# Patient Record
Sex: Female | Born: 1965 | Race: White | Hispanic: No | Smoking: Never smoker
Health system: Southern US, Community
[De-identification: ages and names within clinical notes are randomized; demographics above are authoritative.]

## PROBLEM LIST (undated history)

## (undated) ENCOUNTER — Emergency Department (HOSPITAL_COMMUNITY): Discharge status: Absent without leave | Payer: Medicare Other

## (undated) DIAGNOSIS — F329 Major depressive disorder, single episode, unspecified: Secondary | ICD-10-CM

## (undated) DIAGNOSIS — M549 Dorsalgia, unspecified: Secondary | ICD-10-CM

## (undated) DIAGNOSIS — F259 Schizoaffective disorder, unspecified: Secondary | ICD-10-CM

## (undated) DIAGNOSIS — F32A Depression, unspecified: Secondary | ICD-10-CM

## (undated) DIAGNOSIS — K219 Gastro-esophageal reflux disease without esophagitis: Secondary | ICD-10-CM

## (undated) DIAGNOSIS — E7800 Pure hypercholesterolemia, unspecified: Secondary | ICD-10-CM

## (undated) HISTORY — PX: GASTROSTOMY W/ FEEDING TUBE: SUR642

## (undated) HISTORY — PX: TONSILLECTOMY AND ADENOIDECTOMY: SUR1326

## (undated) HISTORY — PX: UPPER GASTROINTESTINAL ENDOSCOPY: SHX188

## (undated) HISTORY — PX: MM BREAST STEREO BIOPSY LEFT (ARMC HX): HXRAD1824

## (undated) HISTORY — PX: OTHER SURGICAL HISTORY: SHX169

## (undated) HISTORY — PX: COLONOSCOPY: SHX174

---

## 2004-11-08 ENCOUNTER — Other Ambulatory Visit: Admission: RE | Admit: 2004-11-08 | Discharge: 2004-11-08 | Payer: Self-pay | Admitting: Family Medicine

## 2005-11-17 ENCOUNTER — Other Ambulatory Visit: Admission: RE | Admit: 2005-11-17 | Discharge: 2005-11-17 | Payer: Self-pay | Admitting: Family Medicine

## 2005-12-13 ENCOUNTER — Observation Stay (HOSPITAL_COMMUNITY): Admission: EM | Admit: 2005-12-13 | Discharge: 2005-12-15 | Payer: Self-pay | Admitting: Emergency Medicine

## 2005-12-15 ENCOUNTER — Ambulatory Visit: Payer: Self-pay | Admitting: Psychiatry

## 2005-12-15 ENCOUNTER — Inpatient Hospital Stay (HOSPITAL_COMMUNITY): Admission: EM | Admit: 2005-12-15 | Discharge: 2005-12-16 | Payer: Self-pay | Admitting: Psychiatry

## 2007-04-05 ENCOUNTER — Ambulatory Visit: Payer: Self-pay | Admitting: Psychiatry

## 2007-04-05 ENCOUNTER — Inpatient Hospital Stay (HOSPITAL_COMMUNITY): Admission: RE | Admit: 2007-04-05 | Discharge: 2007-04-11 | Payer: Self-pay | Admitting: Psychiatry

## 2008-07-21 ENCOUNTER — Ambulatory Visit: Payer: Self-pay | Admitting: Diagnostic Radiology

## 2008-07-21 ENCOUNTER — Encounter: Payer: Self-pay | Admitting: Emergency Medicine

## 2008-07-22 ENCOUNTER — Inpatient Hospital Stay (HOSPITAL_COMMUNITY): Admission: EM | Admit: 2008-07-22 | Discharge: 2008-08-14 | Payer: Self-pay | Admitting: Internal Medicine

## 2008-11-25 ENCOUNTER — Emergency Department (HOSPITAL_BASED_OUTPATIENT_CLINIC_OR_DEPARTMENT_OTHER): Admission: EM | Admit: 2008-11-25 | Discharge: 2008-11-26 | Payer: Self-pay | Admitting: Emergency Medicine

## 2009-01-20 ENCOUNTER — Ambulatory Visit (HOSPITAL_COMMUNITY): Admission: RE | Admit: 2009-01-20 | Discharge: 2009-01-20 | Payer: Self-pay | Admitting: Psychiatry

## 2010-09-13 LAB — DIFFERENTIAL
Basophils Relative: 2 % — ABNORMAL HIGH (ref 0–1)
Lymphocytes Relative: 17 % (ref 12–46)
Monocytes Absolute: 0.8 10*3/uL (ref 0.1–1.0)
Monocytes Relative: 7 % (ref 3–12)
Neutro Abs: 8.6 10*3/uL — ABNORMAL HIGH (ref 1.7–7.7)
Neutrophils Relative %: 74 % (ref 43–77)

## 2010-09-13 LAB — URINALYSIS, ROUTINE W REFLEX MICROSCOPIC
Glucose, UA: NEGATIVE mg/dL
Ketones, ur: 15 mg/dL — AB
Protein, ur: 30 mg/dL — AB
Urobilinogen, UA: 1 mg/dL (ref 0.0–1.0)

## 2010-09-13 LAB — POCT TOXICOLOGY PANEL

## 2010-09-13 LAB — URINE MICROSCOPIC-ADD ON

## 2010-09-13 LAB — CBC
HCT: 50.1 % — ABNORMAL HIGH (ref 36.0–46.0)
Hemoglobin: 16.8 g/dL — ABNORMAL HIGH (ref 12.0–15.0)
MCHC: 33.4 g/dL (ref 30.0–36.0)
MCV: 85.8 fL (ref 78.0–100.0)
Platelets: 224 10*3/uL (ref 150–400)
RDW: 12.4 % (ref 11.5–15.5)
WBC: 11.8 10*3/uL — ABNORMAL HIGH (ref 4.0–10.5)

## 2010-09-13 LAB — COMPREHENSIVE METABOLIC PANEL
Albumin: 5 g/dL (ref 3.5–5.2)
BUN: 47 mg/dL — ABNORMAL HIGH (ref 6–23)
Calcium: 10.3 mg/dL (ref 8.4–10.5)
Creatinine, Ser: 1.3 mg/dL — ABNORMAL HIGH (ref 0.4–1.2)
Glucose, Bld: 124 mg/dL — ABNORMAL HIGH (ref 70–99)
Total Protein: 8.4 g/dL — ABNORMAL HIGH (ref 6.0–8.3)

## 2010-09-16 LAB — BASIC METABOLIC PANEL
BUN: 4 mg/dL — ABNORMAL LOW (ref 6–23)
BUN: 5 mg/dL — ABNORMAL LOW (ref 6–23)
CO2: 25 mEq/L (ref 19–32)
Chloride: 108 mEq/L (ref 96–112)
Creatinine, Ser: 0.56 mg/dL (ref 0.4–1.2)
Creatinine, Ser: 0.66 mg/dL (ref 0.4–1.2)
GFR calc Af Amer: >60 mL/min (ref >60)
GFR calc Af Amer: >60 mL/min (ref >60)
GFR calc non Af Amer: >60 mL/min (ref >60)
Potassium: 3.8 mEq/L (ref 3.5–5.1)

## 2010-09-16 LAB — COMPREHENSIVE METABOLIC PANEL
ALT: 22 U/L (ref 0–35)
ALT: 24 U/L (ref 0–35)
AST: 21 U/L (ref 0–37)
Albumin: 3 g/dL — ABNORMAL LOW (ref 3.5–5.2)
Albumin: 3 g/dL — ABNORMAL LOW (ref 3.5–5.2)
Alkaline Phosphatase: 35 U/L — ABNORMAL LOW (ref 39–117)
Alkaline Phosphatase: 35 U/L — ABNORMAL LOW (ref 39–117)
Alkaline Phosphatase: 43 U/L (ref 39–117)
BUN: 13 mg/dL (ref 6–23)
BUN: 5 mg/dL — ABNORMAL LOW (ref 6–23)
BUN: 5 mg/dL — ABNORMAL LOW (ref 6–23)
Calcium: 8.2 mg/dL — ABNORMAL LOW (ref 8.4–10.5)
Chloride: 103 mEq/L (ref 96–112)
Chloride: 105 mEq/L (ref 96–112)
Creatinine, Ser: 0.65 mg/dL (ref 0.4–1.2)
GFR calc Af Amer: >60 mL/min (ref >60)
GFR calc non Af Amer: >60 mL/min (ref >60)
Glucose, Bld: 89 mg/dL (ref 70–99)
Glucose, Bld: 95 mg/dL (ref 70–99)
Potassium: 3.6 mEq/L (ref 3.5–5.1)
Potassium: 3.8 mEq/L (ref 3.5–5.1)
Potassium: 3.9 mEq/L (ref 3.5–5.1)
Sodium: 137 mEq/L (ref 135–145)
Sodium: 141 mEq/L (ref 135–145)
Total Bilirubin: 0.5 mg/dL (ref 0.3–1.2)
Total Bilirubin: 0.5 mg/dL (ref 0.3–1.2)
Total Bilirubin: 0.6 mg/dL (ref 0.3–1.2)
Total Protein: 4.4 g/dL — ABNORMAL LOW (ref 6.0–8.3)
Total Protein: 5.5 g/dL — ABNORMAL LOW (ref 6.0–8.3)

## 2010-09-16 LAB — CBC
HCT: 28.2 % — ABNORMAL LOW (ref 36.0–46.0)
HCT: 28.2 % — ABNORMAL LOW (ref 36.0–46.0)
Hemoglobin: 9.6 g/dL — ABNORMAL LOW (ref 12.0–15.0)
Hemoglobin: 9.7 g/dL — ABNORMAL LOW (ref 12.0–15.0)
MCHC: 33.8 g/dL (ref 30.0–36.0)
MCHC: 34.1 g/dL (ref 30.0–36.0)
MCV: 87.3 fL (ref 78.0–100.0)
MCV: 87.4 fL (ref 78.0–100.0)
Platelets: 118 10*3/uL — ABNORMAL LOW (ref 150–400)
Platelets: 132 10*3/uL — ABNORMAL LOW (ref 150–400)
Platelets: 224 10*3/uL (ref 150–400)
Platelets: 228 10*3/uL (ref 150–400)
RBC: 3.02 MIL/uL — ABNORMAL LOW (ref 3.87–5.11)
RBC: 3.44 MIL/uL — ABNORMAL LOW (ref 3.87–5.11)
RDW: 14.6 % (ref 11.5–15.5)
RDW: 14.9 % (ref 11.5–15.5)
WBC: 2.8 10*3/uL — ABNORMAL LOW (ref 4.0–10.5)
WBC: 3.6 10*3/uL — ABNORMAL LOW (ref 4.0–10.5)
WBC: 3.6 10*3/uL — ABNORMAL LOW (ref 4.0–10.5)

## 2010-09-16 LAB — MAGNESIUM
Magnesium: 2.1 mg/dL (ref 1.5–2.5)
Magnesium: 2.2 mg/dL (ref 1.5–2.5)
Magnesium: 2.3 mg/dL (ref 1.5–2.5)

## 2010-09-16 LAB — DIFFERENTIAL
Basophils Absolute: 0 10*3/uL (ref 0.0–0.1)
Basophils Relative: 0 % (ref 0–1)
Eosinophils Absolute: 0.1 10*3/uL (ref 0.0–0.7)
Eosinophils Relative: 4 % (ref 0–5)
Lymphocytes Relative: 18 % (ref 12–46)
Lymphocytes Relative: 25 % (ref 12–46)
Lymphocytes Relative: 36 % (ref 12–46)
Lymphs Abs: 0.6 10*3/uL — ABNORMAL LOW (ref 0.7–4.0)
Lymphs Abs: 1 10*3/uL (ref 0.7–4.0)
Monocytes Relative: 9 % (ref 3–12)
Neutro Abs: 1.5 10*3/uL — ABNORMAL LOW (ref 1.7–7.7)
Neutro Abs: 2.3 10*3/uL (ref 1.7–7.7)
Neutrophils Relative %: 53 % (ref 43–77)
Neutrophils Relative %: 64 % (ref 43–77)

## 2010-09-16 LAB — CULTURE, BLOOD (ROUTINE X 2): Culture: NO GROWTH

## 2010-09-16 LAB — PREALBUMIN
Prealbumin: 14.5 mg/dL — ABNORMAL LOW (ref 18.0–45.0)
Prealbumin: 17.1 mg/dL — ABNORMAL LOW (ref 18.0–45.0)

## 2010-09-16 LAB — PHOSPHORUS
Phosphorus: 4.7 mg/dL — ABNORMAL HIGH (ref 2.3–4.6)
Phosphorus: 4.8 mg/dL — ABNORMAL HIGH (ref 2.3–4.6)
Phosphorus: 5.3 mg/dL — ABNORMAL HIGH (ref 2.3–4.6)

## 2010-09-16 LAB — CORTISOL-AM, BLOOD: Cortisol - AM: 9.1 ug/dL (ref 4.3–22.4)

## 2010-09-16 LAB — TRIGLYCERIDES
Triglycerides: 55 mg/dL (ref <150)
Triglycerides: 99 mg/dL (ref <150)

## 2010-09-16 LAB — CHOLESTEROL, TOTAL: Cholesterol: 137 mg/dL (ref 0–200)

## 2010-09-21 LAB — BASIC METABOLIC PANEL
BUN: 10 mg/dL (ref 6–23)
BUN: 15 mg/dL (ref 6–23)
BUN: 59 mg/dL — ABNORMAL HIGH (ref 6–23)
CO2: 24 mEq/L (ref 19–32)
CO2: 24 mEq/L (ref 19–32)
CO2: 27 mEq/L (ref 19–32)
CO2: 27 mEq/L (ref 19–32)
CO2: 28 mEq/L (ref 19–32)
Calcium: 8.5 mg/dL (ref 8.4–10.5)
Chloride: 103 mEq/L (ref 96–112)
Chloride: 106 mEq/L (ref 96–112)
Chloride: 108 mEq/L (ref 96–112)
Chloride: 109 mEq/L (ref 96–112)
Chloride: 110 mEq/L (ref 96–112)
Chloride: 111 mEq/L (ref 96–112)
Creatinine, Ser: 0.67 mg/dL (ref 0.4–1.2)
Creatinine, Ser: 0.76 mg/dL (ref 0.4–1.2)
Creatinine, Ser: 0.88 mg/dL (ref 0.4–1.2)
GFR calc Af Amer: >60 mL/min (ref >60)
GFR calc Af Amer: >60 mL/min (ref >60)
GFR calc Af Amer: >60 mL/min (ref >60)
GFR calc Af Amer: >60 mL/min (ref >60)
GFR calc non Af Amer: 44 mL/min — ABNORMAL LOW (ref >60)
GFR calc non Af Amer: >60 mL/min (ref >60)
GFR calc non Af Amer: >60 mL/min (ref >60)
Glucose, Bld: 157 mg/dL — ABNORMAL HIGH (ref 70–99)
Glucose, Bld: 97 mg/dL (ref 70–99)
Glucose, Bld: 97 mg/dL (ref 70–99)
Potassium: 3.3 mEq/L — ABNORMAL LOW (ref 3.5–5.1)
Potassium: 3.7 mEq/L (ref 3.5–5.1)
Potassium: 4.1 mEq/L (ref 3.5–5.1)
Sodium: 138 mEq/L (ref 135–145)
Sodium: 139 mEq/L (ref 135–145)
Sodium: 152 mEq/L — ABNORMAL HIGH (ref 135–145)

## 2010-09-21 LAB — URINALYSIS, ROUTINE W REFLEX MICROSCOPIC
Glucose, UA: NEGATIVE mg/dL
Glucose, UA: NEGATIVE mg/dL
Ketones, ur: 40 mg/dL — AB
Nitrite: NEGATIVE
Protein, ur: 100 mg/dL — AB
Protein, ur: 30 mg/dL — AB
pH: 6 (ref 5.0–8.0)
pH: 6 (ref 5.0–8.0)

## 2010-09-21 LAB — CBC
HCT: 50 % — ABNORMAL HIGH (ref 36.0–46.0)
HCT: 30.1 % — ABNORMAL LOW (ref 36.0–46.0)
HCT: 41.9 % (ref 36.0–46.0)
Hemoglobin: 11 g/dL — ABNORMAL LOW (ref 12.0–15.0)
Hemoglobin: 12 g/dL (ref 12.0–15.0)
Hemoglobin: 14.4 g/dL (ref 12.0–15.0)
MCHC: 34.7 g/dL (ref 30.0–36.0)
MCHC: 33.9 g/dL (ref 30.0–36.0)
MCHC: 34.3 g/dL (ref 30.0–36.0)
MCHC: 34.8 g/dL (ref 30.0–36.0)
MCV: 86.7 fL (ref 78.0–100.0)
MCV: 87 fL (ref 78.0–100.0)
MCV: 87.3 fL (ref 78.0–100.0)
MCV: 87.7 fL (ref 78.0–100.0)
Platelets: 222 10*3/uL (ref 150–400)
Platelets: 112 10*3/uL — ABNORMAL LOW (ref 150–400)
Platelets: 116 10*3/uL — ABNORMAL LOW (ref 150–400)
Platelets: 164 10*3/uL (ref 150–400)
Platelets: 99 10*3/uL — ABNORMAL LOW (ref 150–400)
RBC: 3.64 MIL/uL — ABNORMAL LOW (ref 3.87–5.11)
RBC: 4.03 MIL/uL (ref 3.87–5.11)
RBC: 4.08 MIL/uL (ref 3.87–5.11)
RBC: 4.13 MIL/uL (ref 3.87–5.11)
RDW: 13.9 % (ref 11.5–15.5)
RDW: 13.8 % (ref 11.5–15.5)
RDW: 14 % (ref 11.5–15.5)
WBC: 3 10*3/uL — ABNORMAL LOW (ref 4.0–10.5)
WBC: 3.5 10*3/uL — ABNORMAL LOW (ref 4.0–10.5)
WBC: 4.4 10*3/uL (ref 4.0–10.5)

## 2010-09-21 LAB — COMPREHENSIVE METABOLIC PANEL
ALT: 72 U/L — ABNORMAL HIGH (ref 0–35)
AST: 87 U/L — ABNORMAL HIGH (ref 0–37)
Albumin: 5.8 g/dL — ABNORMAL HIGH (ref 3.5–5.2)
Albumin: 2.8 g/dL — ABNORMAL LOW (ref 3.5–5.2)
Albumin: 2.8 g/dL — ABNORMAL LOW (ref 3.5–5.2)
Albumin: 3.1 g/dL — ABNORMAL LOW (ref 3.5–5.2)
BUN: 71 mg/dL — ABNORMAL HIGH (ref 6–23)
BUN: 15 mg/dL (ref 6–23)
BUN: 17 mg/dL (ref 6–23)
CO2: 26 mEq/L (ref 19–32)
Calcium: 10.7 mg/dL — ABNORMAL HIGH (ref 8.4–10.5)
Calcium: 8.3 mg/dL — ABNORMAL LOW (ref 8.4–10.5)
Calcium: 8.4 mg/dL (ref 8.4–10.5)
Calcium: 8.5 mg/dL (ref 8.4–10.5)
Chloride: 108 mEq/L (ref 96–112)
Chloride: 108 mEq/L (ref 96–112)
Creatinine, Ser: 1.6 mg/dL — ABNORMAL HIGH (ref 0.4–1.2)
Creatinine, Ser: 0.66 mg/dL (ref 0.4–1.2)
Creatinine, Ser: 0.67 mg/dL (ref 0.4–1.2)
Creatinine, Ser: 0.74 mg/dL (ref 0.4–1.2)
GFR calc Af Amer: >60 mL/min (ref >60)
GFR calc non Af Amer: >60 mL/min (ref >60)
GFR calc non Af Amer: >60 mL/min (ref >60)
Glucose, Bld: 115 mg/dL — ABNORMAL HIGH (ref 70–99)
Potassium: 4 mEq/L (ref 3.5–5.1)
Sodium: 138 mEq/L (ref 135–145)
Total Bilirubin: 0.4 mg/dL (ref 0.3–1.2)
Total Bilirubin: 0.5 mg/dL (ref 0.3–1.2)
Total Protein: 9.8 g/dL — ABNORMAL HIGH (ref 6.0–8.3)
Total Protein: 5 g/dL — ABNORMAL LOW (ref 6.0–8.3)

## 2010-09-21 LAB — DIFFERENTIAL
Eosinophils Absolute: 0.1 10*3/uL (ref 0.0–0.7)
Eosinophils Relative: 5 % (ref 0–5)
Lymphocytes Relative: 19 % (ref 12–46)
Lymphocytes Relative: 32 % (ref 12–46)
Lymphs Abs: 1 10*3/uL (ref 0.7–4.0)
Monocytes Absolute: 0.4 10*3/uL (ref 0.1–1.0)
Monocytes Absolute: 0.2 10*3/uL (ref 0.1–1.0)
Monocytes Relative: 6 % (ref 3–12)
Neutro Abs: 6 10*3/uL (ref 1.7–7.7)
Neutrophils Relative %: 75 % (ref 43–77)

## 2010-09-21 LAB — GLUCOSE, CAPILLARY
Glucose-Capillary: 100 mg/dL — ABNORMAL HIGH (ref 70–99)
Glucose-Capillary: 104 mg/dL — ABNORMAL HIGH (ref 70–99)
Glucose-Capillary: 108 mg/dL — ABNORMAL HIGH (ref 70–99)
Glucose-Capillary: 108 mg/dL — ABNORMAL HIGH (ref 70–99)
Glucose-Capillary: 112 mg/dL — ABNORMAL HIGH (ref 70–99)
Glucose-Capillary: 114 mg/dL — ABNORMAL HIGH (ref 70–99)
Glucose-Capillary: 129 mg/dL — ABNORMAL HIGH (ref 70–99)
Glucose-Capillary: 95 mg/dL (ref 70–99)
Glucose-Capillary: 98 mg/dL (ref 70–99)
Glucose-Capillary: 99 mg/dL (ref 70–99)

## 2010-09-21 LAB — PHOSPHORUS
Phosphorus: 2 mg/dL — ABNORMAL LOW (ref 2.3–4.6)
Phosphorus: 2.3 mg/dL (ref 2.3–4.6)
Phosphorus: 3.4 mg/dL (ref 2.3–4.6)
Phosphorus: 4.1 mg/dL (ref 2.3–4.6)
Phosphorus: 4.6 mg/dL (ref 2.3–4.6)
Phosphorus: 4.9 mg/dL — ABNORMAL HIGH (ref 2.3–4.6)

## 2010-09-21 LAB — CHOLESTEROL, TOTAL
Cholesterol: 128 mg/dL (ref 0–200)
Cholesterol: 129 mg/dL (ref 0–200)

## 2010-09-21 LAB — VALPROIC ACID LEVEL: Valproic Acid Lvl: <10 ug/mL — ABNORMAL LOW (ref 50.0–100.0)

## 2010-09-21 LAB — URINE CULTURE
Colony Count: NO GROWTH
Special Requests: NEGATIVE

## 2010-09-21 LAB — PROTIME-INR
INR: 1 (ref 0.00–1.49)
Prothrombin Time: 13.4 seconds (ref 11.6–15.2)

## 2010-09-21 LAB — URINE MICROSCOPIC-ADD ON

## 2010-09-21 LAB — FOLATE RBC: RBC Folate: 442 ng/mL (ref 180–600)

## 2010-09-21 LAB — MAGNESIUM
Magnesium: 2.1 mg/dL (ref 1.5–2.5)
Magnesium: 2.2 mg/dL (ref 1.5–2.5)

## 2010-09-21 LAB — PREALBUMIN: Prealbumin: 18.1 mg/dL (ref 18.0–45.0)

## 2010-09-21 LAB — APTT: aPTT: 33 seconds (ref 24–37)

## 2010-09-21 LAB — TSH: TSH: 0.788 u[IU]/mL (ref 0.350–4.500)

## 2010-09-21 LAB — TRIGLYCERIDES: Triglycerides: 174 mg/dL — ABNORMAL HIGH (ref <150)

## 2010-10-19 NOTE — H&P (Signed)
NAME:  ADDISYNN, VASSELL NO.:  [id number]   MEDICAL RECORD NO.:  [id number]          PATIENT TYPE:  IPS   LOCATION:  0500                          FACILITY:  [facility]   PHYSICIAN:  Anselm Jungling, MD  DATE OF BIRTH:  June 19, 1965   DATE OF ADMISSION:  04/05/2007  DATE OF DISCHARGE:                       PSYCHIATRIC ADMISSION ASSESSMENT   IDENTIFYING INFORMATION:  This is a 45 year old female voluntarily  admitted on April 05, 2007.   HISTORY OF PRESENT ILLNESS:  The patient presents with a history of  depression.  States she was not able to pull things together.  Has  been feeling this way for some time.  She reports decreased sleep, not  eating well with some weight loss of an unspecified amount.  She states  that she was very concerned about relapsing on alcohol and came here for  help.   PAST PSYCHIATRIC HISTORY:  Was here in July of 2007.  No current  outpatient mental health treatment.  Has been going to [facility]   SOCIAL HISTORY:  This is a 45 year old divorced female, has a 15-year-  old child.  She is unemployed at this time.  Has completed college.  Has  gotten a bachelor's degree in education.  She has a 71 year old child  and lives with that child.  She was in school at [employer] going for nursing  classes and states she has good family support.   FAMILY HISTORY:  None.   ALCOHOL/DRUG HISTORY:  Nonsmoker.  Has been alcohol-free since 2005.   PRIMARY CARE PHYSICIAN:  None currently.   MEDICAL PROBLEMS:  Denies any acute or chronic health issues.   MEDICATIONS:  None prior to arrival.   ALLERGIES:  SULFA (has a burning sensation).   PHYSICAL EXAMINATION:  GENERAL:  Thin middle-aged female in no acute  distress.  Does appear somewhat pale.  VITAL SIGNS:  Her heart rate 91, respirations 18, blood pressure 116/78,  height 5 feet tall, weight 108 pounds.  HEAD:  Atraumatic.  NECK:  Negative lymphadenopathy.  CHEST:  Clear.  HEART:  Regular rate and  rhythm.  BREASTS:  Exam was deferred.  ABDOMEN:  Soft, nontender abdomen.  PELVIC:  Exam was deferred.  GU:  Exam was deferred.  EXTREMITIES:  Moves all extremities.  No clubbing.  No edema.  5+  against resistance.  SKIN:  Warm and dry.  Again, she appears somewhat pale.  NEUROLOGIC:  Findings are intact.  No tics.  No tremors.   LABORATORY DATA:  No labs were drawn.  The patient does not feel she  needs any laboratory work done.   MENTAL STATUS EXAM:  She is fully alert, cooperative, casually dressed.  Good eye contact.  Her speech is soft-spoken and clear.  Mood is  neutral.  The patient is flat and sad.  Thought processes are coherent.  No evidence of any thought disorder.  Cognitive function intact.  Memory  is good.  Judgment and insight are fair.  She appears sincere.   DIAGNOSES:  AXIS I:  Depressive disorder not otherwise specified.  AXIS II:  Deferred.  AXIS III:  No known medical problems.  AXIS IV:  Problems with occupation, other psychosocial problems with  being divorced, single mom.  AXIS V:  Current 45.   PLAN:  Contract for safety.  Stabilize mood and thinking.  The patient  is considering a need for antidepressant.  Does not feel she needs any  medication at the time.  Feels she needs more structure and get back to  therapy and get back to her meetings.  Casemanager is to obtain her  follow-up appointments.   TENTATIVE LENGTH OF STAY:  Three to four days.      Landry Corporal, N.P.      Anselm Jungling, MD  Electronically Signed    JO/MEDQ  D:  04/06/2007  T:  04/06/2007  Job:  [id number]

## 2010-10-19 NOTE — Consult Note (Signed)
[name], [name]                  ACCOUNT NO.:  [id number]   MEDICAL RECORD NO.:  [id number]          PATIENT TYPE:  INP   LOCATION:  1519                         FACILITY:  [facility]   PHYSICIAN:  Antonietta Breach, M.D.  DATE OF BIRTH:  1965/10/20   DATE OF CONSULTATION:  DATE OF DISCHARGE:                                 CONSULTATION   PAST PSYCHIATRIC HISTORY:  In review of the past medical record, [name] does have a history of admission to the [facility] in October 2008.  She had a discharge from the [facility] on Wellbutrin XL 150 mg daily.  She was also on Ambien at  that time.   She has a history of depression and alcohol use.  She was also admitted  to the [facility] in July 2007.   FAMILY PSYCHIATRIC HISTORY:  None known.   SOCIAL HISTORY:  Marital status divorced.  She does have a 6 year old  child.  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   PAST MEDICAL HISTORY:  Please see the above.   ALLERGIES:  She has NO KNOWN DRUG ALLERGIES.   MEDICATIONS:  The MAR is reviewed.  [name] is on:  1. Ativan 0.5 mg q.8 h p.r.n..  2. She is on Ambien 5 mg q.h.s. p.r.n.   LABORATORY DATA:  Is reviewed.  TSH is negative.  Aspirin and alcohol  are negative.  SGOT 38, SGPT 23.   REVIEW OF SYSTEMS:  Unremarkable.   MENTAL STATUS EXAM:  Please see the history of present illness.  [name] has poor eye contact, constricted affect.  Grossly impaired  judgment and insight.   ASSESSMENT:  AXIS I:  293.83  Mood disorder not otherwise specified,  rule out major depressive disorder recurrent severe.   RECOMMENDATIONS:  Given Ms. [name]'s very severe depression with refusal  of taking p.o. would try Remeron SolTab 15 mg p.o. or sublingual q. 1800  as the initial starting anti-depression dosage.     Antonietta Breach, M.D.  Electronically Signed    JW/MEDQ  D:  07/26/2008  T:  07/26/2008  Job:  [id number]

## 2010-10-19 NOTE — H&P (Signed)
[name], [name]                  ACCOUNT NO.:  [id number]   MEDICAL RECORD NO.:  [id number]          PATIENT TYPE:  INP   LOCATION:  1403                         FACILITY:  [facility]   PHYSICIAN:  Della Goo, M.D. DATE OF BIRTH:  February 28, 1966   DATE OF ADMISSION:  07/22/2008  DATE OF DISCHARGE:                              HISTORY & PHYSICAL   PRIMARY CARE PHYSICIAN:  Unassigned.   CHIEF COMPLAINT:  Not eating.   HISTORY OF PRESENT ILLNESS:  This is a 45 year old female who was seen  at the [facility] emergency department and transferred to  [facility] for admission and medical clearance.  She was  brought to the emergency department after papers had been served by the  sheriff to have her involuntarily committed.  She was brought to the  hospital for a medical clearance and is accompanied by her brother.  The  patient is very despondent and has psychomotor retardation and has  increased hesitation with speaking.  Per her brother, she has a long  history of psychiatric illness.  He states that he had not seen her  since around Christmas and reports were given that the patient has been  locked up in her house for the last 3-4 weeks refusing to talk to  anyone.  The patient also has lost a considerable amount of weight, he  estimates possibly 30 pounds, and he reports that she has not been  eating and drinking.   The patient reports having some shortness of breath and feeling very  tired.  She also states that she has had difficulty sleeping.  She also  states that she has had congestion and coughing up clear mucus, and she  has been observed to be wiping her lips and tongue repetitively.   The patient was evaluated at [facility], laboratory studies  were performed and her chemistry returned with abnormal results, a  sodium level of 160 and a BUN of 71 with a creatinine of 1.6.  She was  referred for medical clearance first because of her severe  hypernatremia.   PAST MEDICAL HISTORY:  1. Previous suicide attempts.  2. Schizophrenia,  3. Bipolar disorder.  4. Severe depression.  5. History of herpes simplex II.   PAST SURGICAL HISTORY:  History of a tonsillectomy and adenoidectomy.   ALLERGIES:  NO KNOWN DRUG ALLERGIES.   MEDICATIONS:  None at this time.  The patient reports being off  medications for 1-2 years and has not seen a psychiatrist in a very long  time.   SOCIAL HISTORY:  The patient lives alone.  She is a nonsmoker,  nondrinker.   FAMILY HISTORY:  Noncontributory.   PHYSICAL EXAMINATION FINDINGS:  GENERAL:  This is a cachectic-appearing  45 year old female in discomfort but no acute distress.  VITAL SIGNS:  Her vital signs at initial intake at [facility]  were temperature 97.2, blood pressure 115/87, heart rate 114,  respirations 20, O2 saturations 98-99%.  HEENT:  Normocephalic, atraumatic.  There is no scleral icterus.  Pupils  are equally round,  reactive to light.  Extraocular movements are intact.  Funduscopic benign.  Nares are patent bilaterally.  Oropharynx is clear.  Mucosa is dry.  NECK:  Supple, full range of motion.  No murmurs, gallops or rubs.  LUNGS:  Clear to auscultation bilaterally.  ABDOMEN:  Positive bowel sounds, soft, nontender, nondistended.  No  hepatosplenomegaly.  EXTREMITIES:  Without cyanosis, clubbing or edema.   LABORATORY STUDIES:  Chemistry with a sodium of 160, potassium 4.0,  chloride 116, carbon dioxide 20, BUN 71, creatinine 1.6 and glucose 115.  Urinalysis reveals small urine hemoglobin, large bilirubin, large  leukocyte esterase.  Alcohol level less than 5.  Acetaminophen level  less than 10.0 and salicylate level less than 1.0.  CT scan of the head  was performed and was found to have no evidence of intracranial  hemorrhage, brain edema, acute infarction intracranial masses or mass  effect.   ASSESSMENT:  A 45 year old female being admitted with:  1.  Hypernatremia.  2. Anorexia.  3. Dehydration.  4. Severe depression.  5. Psychiatric disorder.   PLAN:  The patient will be admitted to a telemetry area and will be  started on hydration therapy with D5W.  Her sodium levels will be  monitored, as well as her BUN and creatinine.  A chest x-ray will be  ordered to evaluate for possible pneumonia.  Also a CBC with  differential will be ordered, as well.  A continuous pulse oximetry has  been ordered secondary to the patient's complaints of shortness of  breath along with supplemental oxygen therapy.  Also, medications for  anxiety and agitation have also been ordered as needed.  A consultation  will be placed with Behavioral Health/psychiatry for further evaluation  and placement for continued treatment of this patient.  The patient will  be placed on DVT and GI prophylaxis, as well.      Della Goo, M.D.  Electronically Signed     HJ/MEDQ  D:  07/22/2008  T:  07/22/2008  Job:  [id number]

## 2010-10-19 NOTE — Discharge Summary (Signed)
[name], [name]                  ACCOUNT NO.:  [id number]   MEDICAL RECORD NO.:  [id number]          PATIENT TYPE:  INP   LOCATION:  1519                         [facility]ACILITY:  [facility]   PHYSICIAN:  Hillery Aldo, M.D.   DATE OF BIRTH:  Jun 10, 1965   DATE OF ADMISSION:  07/22/2008  DATE OF DISCHARGE:                               DISCHARGE SUMMARY   FINAL DISCHARGE DIAGNOSES:  1. Anorexia nervosa.  2. Severe protein calorie malnutrition.  3. Dehydration.  4. Acute renal failure.  5. Hypokalemia.  6. Psychotic mutism.  7. Depression.  8. Hypophosphatasemia.  9. Hyperphosphatemia.  10.Pancytopenia.   DISCHARGE MEDICATIONS:  1. Jevity 1.25 cans daily (may hold tube feeding if the patient      consumes a meal).  2. Claritin 10 mg via PEG tube daily.  3. Reglan 5 mg via PEG tube 30 minutes before tube feeds.  4. Zyprexa 15 mg via PEG tube daily at bedtime.  5. Protonix 40 mg via PEG tube daily.  6. Effexor 37.5 mg via PEG tube b.i.d. (can titrate up to 75 mg as      tolerated).  7. Tylenol 650 mg via PEG tube q.4 h. p.r.n.  8. Free water boluses 50 mL via PEG tube pre and post tube feeds.   For complete list of the consultations, brief admission history of  present illness, procedures and diagnostic studies, and hospital course  through August 07, 2008, please see my previously dictated progress note.   ADDITIONAL PROCEDURES AND DIAGNOSTIC STUDIES:  None.   DISCHARGE LABORATORY VALUES:  White blood cell count was 3.6, hemoglobin  9.6, hematocrit 28.2, platelets 224.  Prealbumin was 14.5.  Triglycerides 99.  Total cholesterol 137.  Magnesium 2.2.  Phosphorus  4.5.  Comprehensive metabolic panel was within normal limits with the  exception of a low total protein of 5.5 and albumin at 3.0.  Calcium was  also low at 8.2.  The a.m. cortisol was 99.1.   HOSPITAL COURSE BY PROBLEM:  From August 07, 2008, through August 14, 2008:  1. Anorexia with severe weight loss and protein-calorie  malnutrition:      The patient continues to receive Jevity 1.2, up to 5 cans daily.      She is beginning to eat at times but still is requiring tube      feeding support for her nutrition.  At this time, she is tolerating      tube feeds without problems.  2. Dehydration with acute renal failure:  No further problems.  3. Electrolyte imbalances:  The patient's electrolytes have been on      the normal side.  4. Psychotic mutism/depression:  The patient continues on Zyprexa.      Effexor has been titrated up to 37.5 mg b.i.d.  This can be further      up titrated to 75 mg b.i.d. as tolerated.  5. Mild pancytopenia:  The patient's thrombocytopenia has resolved.      She still has some mild leukopenia and anemia.  I suspect this is      secondary to  nutritional deficiencies and will resolve over time.  6. Hypotension:  The patient has been hemodynamically stable.  Septic      workup was negative.  She has no evidence of adrenal insufficiency.      She is mentating well.   DISPOSITION:  The patient is medically stable and will be discharged to  an inpatient psychiatric facility on August 14, 2008.   Time spent coordinating care for discharge and discharge instructions  equals 35 minutes.      Hillery Aldo, M.D.  Electronically Signed     CR/MEDQ  D:  08/13/2008  T:  08/13/2008  Job:  [id number]

## 2010-10-19 NOTE — Group Therapy Note (Signed)
[name], [name]                  ACCOUNT NO.:  [id number]   MEDICAL RECORD NO.:  [id number]          PATIENT TYPE:  INP   LOCATION:  1519                         [facility]ACILITY:  [facility]   PHYSICIAN:  Hillery Aldo, M.D.   DATE OF BIRTH:  03-06-1966                                 PROGRESS NOTE   PRIMARY CARE PHYSICIAN:  None.   DISCHARGE DIAGNOSES:  1. Anorexia nervosa.  2. Severe protein-calorie malnutrition.  3. Dehydration.  4. Acute renal failure.  5. Hypokalemia.  6. Psychotic mutism.  7. Depression.  8. Hypophosphatemia.  9. Hyperphosphatemia.  10.Pancytopenia.   DISCHARGE MEDICATIONS:  1. Jevity 1.2 five cans daily.  2. Claritin 10 mg via PEG tube daily.  3. Reglan 5 mg via PEG tube 30 minutes before tube feeds.  4. Zyprexa 15 mg via PEG tube every night.  5. Protonix 40 mg via PEG tube daily.  6. Effexor 37.5 mg via PEG tube daily with plans to up titrate Effexor      to 75 mg via PEG tube b.i.d.  7. Tylenol 650 mg via PEG tube q.4 h. p.r.n.  8. Free water boluses 50 mL via PEG pre and post tube feeds.   CONSULTATIONS:  1. Dr. Jeanie Sewer of psychiatry.  2. Dr. Miles Costain of interventional radiology.   BRIEF ADMISSION HISTORY OF PRESENT ILLNESS:  The patient is a 45 year old female who was brought to the emergency  department after papers had been certified by the sheriff to have her  involuntarily committed.  She was brought specifically for medical  clearance but was found to be dehydrated and in acute renal failure and  therefore was admitted for medical stabilization.  For the full details,  please see the dictated report done by Dr. Lovell Sheehan.   PROCEDURES AND DIAGNOSTIC STUDIES:  1. CT scan of the head on July 21, 2008, was negative.  2. Chest x-ray on July 22, 2008, showed mild hyperinflation with      no acute cardiopulmonary disease.  3. Chest x-ray on July 23, 2008, status post placement of a      peripherally inserted central catheter showed the tip  overlying the      cavoatrial junction with no active disease.  4. Fluoroscopic insertion of a 20-French pull-through gastrostomy tube      placed on August 01, 2008 by Dr. Miles Costain.  5. Chest x-ray on August 05, 2008, showed no acute disease.   DISCHARGE LABORATORY VALUES:  To be dictated at the time of actual discharge.   HOSPITAL COURSE BY PROBLEM:  1. Anorexia with severe weight loss and protein-calorie malnutrition:      The patient was admitted and refused to eat.  According to family,      she has lost approximately 30 pounds in the last 1-2 months.      Attempts were made to put a panda tube in; however, the patient was      not cooperative with this.  She was subsequently put on total      nutritional admixture to address her severe protein-calorie  malnutrition.  The patient continued to refuse to eat, and      ultimately consent was obtained from the patient's mother to place      a percutaneous inserted enteral gastrostomy tube.  The patient has      been commenced on tube feeds and is at the dietitian recommended      goal rate of 5 can of Jevity 1.2 daily.  The patient has had      minimal complaints, although she does not like having the PEG tube      in, she has been cooperative with tube feedings.  2.  Dehydration      with acute renal failure:  The patient's dehydration and acute      renal failure have resolved.  2. Electrolyte imbalances:  The patient has been hypokalemia,      hypophosphatemic, as well as hyperphosphatemic.  Her      hypophosphatemia was felt to be due to refeeding syndrome, and she      was appropriately repleted.  At this point, her phosphorus has been      a little on the high side.  Accordingly, an intact PTH was checked      and found to be within normal limits.  The patient's potassium has      been maintaining with appropriate repletion.  Overall, her      electrolytes have been stable.  3. Psychotic mutism/depression:  The patient has  been seen and      evaluated by Dr. Jeanie Sewer.  She was put on Zyprexa to address      psychotic mutism.  Effexor was added to help deal with her      underlying depression.  Plans are to send the patient to a      psychiatric facility for ongoing inpatient therapy and long-term      treatment.  4. Mild pancytopenia:  The patient does have a mild pancytopenia which      we have been monitoring through the course of her hospital stay.      Overall, her blood counts have remained stable.  She does have      pancytopenia.  I suspect this is secondary to nutritional      deficiencies and will resolve over time.  If not, an outpatient      bone marrow biopsy may be in order.  5. Hypotension:  The patient's a.m. cortisol was normal.  There are no      signs or symptoms of a septic process.  I suspect her blood      pressure simply runs low.  She was put on IV fluids, and at this      time, her blood pressure is stable.  We will discontinue the IV      fluids and monitor her.   DISPOSITION:  The patient is medically stable for transfer to a psychiatric hospital  when a bed is identified.  Discharge summary addendum will be dictated  at that time.      Hillery Aldo, M.D.  Electronically Signed     CR/MEDQ  D:  08/07/2008  T:  08/07/2008  Job:  [id number]

## 2010-10-19 NOTE — Discharge Summary (Signed)
NAMECATHE, [name]                  ACCOUNT NO.:  [id number]   MEDICAL RECORD NO.:  [id number]          PATIENT TYPE:  INP   LOCATION:  1519                         FACILITY:  [facility]   PHYSICIAN:  Pedro Earls, MD     DATE OF BIRTH:  06-12-1965   DATE OF ADMISSION:  07/22/2008  DATE OF DISCHARGE:  07/28/2008                               DISCHARGE SUMMARY   REASON FOR TRANSFER:  Inpatient psych facility transfer for severe  depression and possible psychosis.   DISCHARGE DIAGNOSES:  1. Hyponatremia, resolved.  2. Dehydration, improving and resolved.  3. Failure to thrive as a result of severe depression and psychosis.  4. Hypophosphatemia, resolved.   CONSULTATIONS:  Behavioral Health.   HOSPITAL COURSE:  This is a 45 year old white female patient who had a  past medical history significant for suicidal attempts, schizophrenia,  bipolar disorder, severe depression who was admitted on July 22, 2008, with problems of not eating.  Patient was found to be hyponatremic  and severe dehydration and depression, was initially admitted, and  hydrated with D5W.  Dr. Antonietta Breach was consulted for depression and  the recommendations were to start her on Remeron.   Patient also had a nutritional assessment done as well.  Patient was  continued on TNA.   Patient's condition remained stable throughout the stay.  There were no  other acute events noted.  Patient's hydration was improving and  patient's sodium remained stable.  On July 27, 2008, the plan was to  discharge patient to inpatient psych facility.  Patient was stable at  the time of transfer and was medically stable to be transferred.  Please  see the detailed reconciled list of medication given to the patient at  the time of discharge.      Pedro Earls, MD  Electronically Signed     NS/MEDQ  D:  07/28/2008  T:  07/28/2008  Job:  [id number]

## 2010-10-19 NOTE — Consult Note (Signed)
NAMEJANELYS, [name]                  ACCOUNT NO.:  [id number]   MEDICAL RECORD NO.:  [id number]          PATIENT TYPE:  INP   LOCATION:  1519                         FACILITY:  [facility]   PHYSICIAN:  Antonietta Breach, M.D.  DATE OF BIRTH:  [birth year]   DATE OF CONSULTATION:  07/25/2008  DATE OF DISCHARGE:  08/14/2008                                 CONSULTATION   The attending physician has received additional information from the  patient's mother about her course of symptoms prior to admission.   [name] has continued to not take anything by mouth.  She has mostly  mutism.  At home, her mother told Dr. Darnelle Catalan that the patient became  socially isolated and paranoid to the point that she locked her door and  would not eat anything   MENTAL STATUS EXAM:  [name] looks away from the examiner.  Her head is  down.  Her affect is guarded.  Mood:  She will not talk.   ASSESSMENT:  293.81 psychotic disorder not otherwise specified.  She  does appear to have psychotic mutism at this point with a history of  paranoia prior to this stage.   RECOMMENDATIONS:  Due to her psychosis and secondary risk of potential  lethal self-neglect, would force Zyprexa if Dr. Darnelle Catalan agrees with the  undersigned starting with 5 mg p.o. or IM daily in order to reverse the  psychosis.      Antonietta Breach, M.D.  Electronically Signed     JW/MEDQ  D:  08/31/2008  T:  08/31/2008  Job:  [id number]

## 2010-10-19 NOTE — Consult Note (Signed)
[name], [name]                  ACCOUNT NO.:  [id number]   MEDICAL RECORD NO.:  [id number]          PATIENT TYPE:  INP   LOCATION:  1519                         FACILITY:  [facility]   PHYSICIAN:  Antonietta Breach, M.D.  DATE OF BIRTH:  [birth year]   DATE OF CONSULTATION:  07/30/2008  DATE OF DISCHARGE:                                 CONSULTATION   [name] continues with local mutism.  She refused p.o. Zyprexa today.  She continues with guarded affect, poor eye contact.  However, she is  alert.  She clearly is paranoid.  She looks away from the examiner.  She  will not provide any additional information.   Her facies are flat.   [name] had not demonstrated any rigidity or other extrapyramidal side  effects from the Zyprexa.  She has tolerated it well when she has taken  it by mouth.   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   RECOMMENDATIONS:  The undersigned will order Zyprexa 10 mg p.o. or IM  q.1600 hours for anti psychosis.   Concerning disposition, [name] does not have the capacity for informed  consent given her psychosis and secondary impaired judgment.  She would  be at risk for lethal self-neglect.   She is psychiatrically appropriate for a skilled nursing facility.      Antonietta Breach, M.D.  Electronically Signed     JW/MEDQ  D:  07/30/2008  T:  07/30/2008  Job:  [id number]

## 2010-10-19 NOTE — Consult Note (Signed)
[name], [name]                  ACCOUNT NO.:  [id number]   MEDICAL RECORD NO.:  [id number]          PATIENT TYPE:  INP   LOCATION:  1519                         FACILITY:  [facility]   PHYSICIAN:  Antonietta Breach, M.D.  DATE OF BIRTH:  03-13-1966   DATE OF CONSULTATION:  07/23/2008  DATE OF DISCHARGE:                                 CONSULTATION   REQUESTING PHYSICIAN:  [facility].   REASON FOR CONSULTATION:  Depression.   HISTORY OF PRESENT ILLNESS:  [name] is a 45 year old female admitted  to the [facility] on July 22, 2008 due to hypernatremia.   IDENTIFICATION:  She has approximately 9 weeks of pensive withdrawal,  poor energy, depressed mood and reduced appetite.  She has had decreased  p.o. intake with a loss of 30 pounds, and dehydration.   She is 95% mute and will not verbalize at all with the undersigned  today.   She continues to lie in her bed.   DICTATION ENDED AT THIS POINT.      Antonietta Breach, M.D.  Electronically Signed     JW/MEDQ  D:  07/26/2008  T:  07/26/2008  Job:  [id number]

## 2010-10-22 NOTE — Discharge Summary (Signed)
[name], [name]                  ACCOUNT NO.:  [id number]   MEDICAL RECORD NO.:  [id number]          PATIENT TYPE:  IPS   LOCATION:  0508                          [facility]ACILITY[facility]  [facility]   PHYSICIAN:  Jasmine Pang, M.D. DATE OF BIRTH:  03-08-1966   DATE OF ADMISSION:  04/05/2007  DATE OF DISCHARGE:  04/11/2007                               DISCHARGE SUMMARY   IDENTIFICATION:  This is a 45 year old female who was admitted on a  voluntary basis on April 05, 2007.   HISTORY OF PRESENT ILLNESS:  The patient presents with a history of  depression.  She states she was not able to pull things together.  She  states she has been feeling this way for sometime.  She reports  decreased sleep, not eating well with some weight loss of unspecified  amount.  She states that she was very concerned about relapsing on  alcohol and came here for help.  She was here in July 2007.  There is no  current outpatient mental health treatment.  She has been going to [employer].  She is a nonsmoker.  She has been alcohol-free since 2005.  She denies any acute or chronic health issues.  She was on no  medications.  She has allergy to SULFA (burning sensation).   PHYSICAL FINDINGS:  The patient's physical exam was within normal  limits.  She was healthy with no acute medical or physical problems  noted.   HOSPITAL COURSE:  Upon admission, the patient was started on Ambien 10  mg p.o. q.h.s. p.r.n.  She was also started on Wellbutrin XL 150 mg p.o.  daily.  The patient was unclear whether she wanted to be on the  Wellbutrin or not and vacillated between asking me to stop the  medication versus wanting to take it.  I explained that I would leave  the order there for her Wellbutrin, and if she do not want to take it,  she was not required to do so.  The patient tolerated medications well  with no significant side effects.  She was initially reserved and lying  in bed.  She was appropriate in individual therapy  with me.  She was  able to participate appropriately in unit therapeutic groups and  activities.  As hospitalization progressed, she became less depressed  and less anxious.  There was no suicidal ideation.  She continued to  vacillate as to whether to stay on her Wellbutrin, but did decide to do  this.  She discussed her family and stated that her parents are very  supportive.  She has a 43 year old son.  She at one point felt her main  problem was alcohol.  She had a family session with her mother.  She was  feeling better and her mother was very supportive.  On April 11, 2007,  she felt a little worse like she had gone backwards.  She was  initially looking for longer-term treatment at [facility] for her alcohol  abuse.  She went back and forth about this treatment as well.  On  April 11, 2007, mental status had improved markedly from admission  status.  The patient was sleeping and eating well.  She was friendly and  cooperative with good eye contact.  Speech was normal rate and flow.  She felt like her mood was somewhat anxious and depressed, but less so  than upon admission.  Affect was consistent with mood.  She denies  suicidal or homicidal ideation.  No thoughts of self-injurious behavior.  No auditory or visual hallucinations.  No paranoia or delusions.  Thoughts were logical and goal-directed.  Thought content, no  predominant theme.  Cognitive was grossly back to baseline.  It was felt  the patient was safe to be discharged today.   DISCHARGE DIAGNOSES:  AXIS I:  Depressive disorder, not otherwise  specified.  History of alcohol abuse.  AXIS II:  None.  AXIS III:  None.  AXIS IV:  Severe problems with occupation, other psychosocial problems  with being divorced, and a single mom, and burden of psychiatric  illness.  AXIS V:  Global assessment of functioning was 50 upon discharge.  Global  assessment of functioning was 45 upon admission.  GAF was 65, highest  past  year.   POST HOSPITAL CARE PLAN:  The patient had no specific activity level or  dietary restrictions.  The patient will be seen by Bearl Mulberry at  [facility] on April 13, 2007, at 5:00 p.m.   DISCHARGE MEDICATIONS:  Wellbutrin XL 150 mg daily and Ambien 10 mg at  bedtime p.r.n. insomnia.      Jasmine Pang, M.D.  Electronically Signed     BHS/MEDQ  D:  05/01/2007  T:  05/02/2007  Job:  [id number]

## 2010-10-22 NOTE — H&P (Signed)
[name], [name]                  ACCOUNT NO.:  [id number]   MEDICAL RECORD NO.:  [id number]          PATIENT TYPE:  INP   LOCATION:  1830                         FACILITY:  [facility]   PHYSICIAN:  Jackie Plum, M.D.DATE OF BIRTH:  12/10/1965   DATE OF ADMISSION:  12/13/2005  DATE OF DISCHARGE:                                HISTORY & PHYSICAL   REASON FOR ADMISSION:  For 23-hour observation.   CHIEF COMPLAINT:  Suicide attempt, drug abuse.   HISTORY OF PRESENT ILLNESS:  The patient is a 45 year old Caucasian lady who  was brought to the hospital by ambulance after injecting her left arm with  crushed mixture of Ambien 10 mg pills with sodium chloride and Nadolol.  According to the patient, she has been under a lot of stress, but she did  not really specify and articulate about taking those steps in her effort to  commit suicide.  In the ED, the patient was seen by Dr. Jennette Kettle.  Lab work  was obtained.  However, __________ overnight in view of the fact she had  taken a combination of these medicines with Nadolol to make sure she does  not bottom out on her blood pressure.   PAST MEDICAL HISTORY:  History of hypertension and anxiety.   FAMILY HISTORY:  Negative for heart disease, diabetes mellitus.   SOCIAL HISTORY:  The patient is currently trying to enter nursing school.  She does not smoke cigarettes and does not drink alcohol.   REVIEW OF SYSTEMS:  The patient denies any fever or chills, chest pain,  shortness of breath, hypertension, PND, orthopnea, visual changes,  headaches, extremity weakness, dysuria, polyuria, polydipsia, diarrhea,  constipation.   PHYSICAL EXAMINATION:  VITAL SIGNS:  Blood pressure 110/68, pulse 55,  temperature 96.9, oxygen saturation on room air 100%.  GENERAL:  She was comfortable looking.  In no acute distress.  HEENT:  Normocephalic, atraumatic.  Pupils were equal, round, reactive to  light.  Extraocular muscles intact..  Oropharynx was  moist.  NECK:  Supple.  LUNGS:  Clear to auscultation.  CARDIAC:  Regular.  No gallops.  ABDOMEN:  Soft.  Bowel sounds were present.  EXTREMITIES:  No cyanosis.  CNS:  She was alert, oriented.  She had depressed facies.  She moved all  extremities.   LABORATORY DATA:  Sodium 137, potassium 4.5, chloride 109, BUN 8, glucose  91.  Venous pH 7.377, pCO2 33.5, bicarbonate 19.6.  Hematocrit 37.8,  hemoglobin 12.6.  Creatinine 1.2.  Alcohol level less than 5.  Drug screen  studies were all unremarkable.   IMPRESSION:  1.  Suicide attempt.  2.  Drug abuse.   The patient is admitted __________.  She remains hemodynamically stable and  hopefully she will continue to do so.  Will give her some supportive care  including IV normal saline at a moderate rate and get her to be seen by  psychiatrist tomorrow and possibly be discharged to psychiatric hospital at  if she is deemed appropriate for admission psychiatric treatment.      Jackie Plum, M.D.  Electronically Signed  GO/MEDQ  D:  12/14/2005  T:  12/14/2005  Job:  [id number]

## 2010-10-22 NOTE — Consult Note (Signed)
[name], WARZECHA                  ACCOUNT NO.:  [id number]   MEDICAL RECORD NO.:  [id number]          PATIENT TYPE:  INP   LOCATION:  1519                         FACILITY:  [facility]   PHYSICIAN:  Antonietta Breach, M.D.  DATE OF BIRTH:  [birth year]   DATE OF CONSULTATION:  08/23/2008  DATE OF DISCHARGE:  08/14/2008                                 CONSULTATION   REASON FOR CONSULTATION:  This is a follow-up consultation report for  severe depression and psychosis.   Ms. Fryberger continues to verbalize very little, she is mostly mute.  She  has anhedonia.  She did ambulate some with the tech, but her energy is  still very poor.   The undersigned gathered some history from the mother.  Mrs. Daphine Deutscher  confirmed a history of electroconvulsive therapy.  She has had 3 courses  of ECT which resulted in improved energy and improved interests.   These occurred during her late teens and early 57s.  The undersigned  shared no information with the patient's mother, only received history  from the patient's mother.   EXAMINATION:  VITAL SIGNS:  Temperature 98.8, pulse 80, respiratory rate  16, blood pressure 80/54, O2 saturation on room air 99%.   MENTAL STATUS EXAM:  Ms. Gavel is lying in a partially reclined supine  position in her hospital bed with poor eye contact, her eyes are open.  She looks periodically around the room.  She is appearing alert by her  facial expression and eye movements.  Her affect is flat, she has a flat  facial expression.  She is mute throughout the exam.  The undersigned  does make some reflective short ego-supportive comments, however the  patient does not respond.   ASSESSMENT:  Major depressive disorder, recurrent, with psychotic  features.   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   She is assessed to lack the capacity  for informed consent.  She would be  at risk for lethal self-neglect given the severity of her symptoms.   RECOMMENDATIONS:  1. Would start venlafaxine at 37.5 mg p.o. every morning and increase      by 37.5 mg per day to the target dose of 75 mg by mouth 2 times      daily while monitoring blood pressure, as hypertension can be an      adverse side effect of venlafaxine.  2. Would continue the antipsychotic trial with Zyprexa and increase      the Zyprexa to 15 mg by mouth or intramuscularly daily.   DISCUSSION:  While the patient cannot provide a description of her  internal mood state, based upon the fact that she is not responding to  the Zyprexa trial, and based upon her past history of depression as well  as her preference to stay in bed, venlafaxine is now started for anti-  depression.   Would admit to an inpatient psychiatric ward, as soon as possible, for  further evaluation and treatment.  Based upon the patient's history of  response, given by the mother, electroconvulsive therapy could be an  option.   Would continue efforts at low-stimulation ego support to facilitate a  therapeutic alliance and would provide a quiet low-stimulation  environment.      Antonietta Breach, M.D.  Electronically Signed     JW/MEDQ  D:  08/23/2008  T:  08/24/2008  Job:  [id number]

## 2010-10-22 NOTE — Discharge Summary (Signed)
[name], [name]                  ACCOUNT NO.:  [id number]   MEDICAL RECORD NO.:  [id number]          PATIENT TYPE:  IPS   LOCATION:  0300                          [facility]ILITY[facility]  [facility]   PHYSICIAN:  Anselm Jungling, MD  DATE OF BIRTH:  April 27, 1966   DATE OF ADMISSION:  12/14/2005  DATE OF DISCHARGE:  12/16/2005                                 DISCHARGE SUMMARY   IDENTIFYING DATA/REASON FOR ADMISSION:  The patient is a 45 year old  separated white female, mother of a 66 year old, part-time Engineer, site,  admitted for depression, in the aftermath of an overdose suicide attempt.  She is a patient of Dr. Emerson Monte.  She had been on an outpatient  regimen of Wellbutrin 200 mg twice daily.  Upon admission, she indicated  that she was glad that she survived her overdose and had no further suicidal  ideation.  Please refer to the admission note for further details pertaining  to the symptoms, circumstances and history that led to her hospitalization.   INITIAL DIAGNOSTIC IMPRESSION:  She was given an initial AXIS I diagnosis of  major depressive disorder, recurrent without psychotic features.   MEDICAL/LABORATORY:  The patient came to [place] in good health, but was taking  Protonix for GERD, Famvir for genital herpes, and Claritin for seasonal  allergies as well as an oral birth control preparation.  These were  continued.  There were no acute medical issues.   HOSPITAL COURSE:  The patient was admitted to the adult inpatient  psychiatric service.  She presented as a well-nourished, well-developed  female who was pleasant, cooperative, fully oriented and well-organized.  Her mood was depressed with sad affect, but she denied suicidal ideation.  Her thoughts and speech were normally organized.  There was nothing to  suggest any underlying psychosis or formal thought disorder.  She was  continued on a regimen of Wellbutrin SR 200 mg twice daily.  She was never  very forthcoming as to the  specific reasons that led to her overdose, but  gave strong reassurances that she was not having any further thoughts of  harming herself.  She indicated that she planned to return home to live upon  discharge from the hospital.  The patient was not really interested in  having any family come in and being involved with her treatment.  She  indicated that she felt strongly about continuing to see Dr. Nolen Mu on an  outpatient basis, and Valinda Hoar, her therapist.   The patient requested discharge on the third hospital day.   AFTERCARE:  The patient was to follow-up with Dr. Nolen Mu on January 02, 2006,  and with Valinda Hoar also on January 02, 2006.  She was also given contact  information for vocational rehabilitation, and an appointment on December 27, 2005.   DISCHARGE MEDICATIONS:  1.  Protonix 40 mg daily.  2.  Wellbutrin SR 200 mg b.i.d.  3.  Famvir 125 mg daily.  4.  Claritin 10 mg daily.  5.  Her usual oral contraceptive.   DISCHARGE DIAGNOSES:  AXIS I:  Major depressive  disorder, recurrent without  psychotic features.  AXIS II:  Deferred.  AXIS III:  Genital herpes, seasonal allergies, gastroesophageal reflux  disease.  AXIS IV:  Stressors:  Severe.  AXIS V:  GAF on discharge 65.           ______________________________  Anselm Jungling, MD  Electronically Signed     SPB/MEDQ  D:  12/19/2005  T:  12/19/2005  Job:  [id number]

## 2012-05-06 ENCOUNTER — Encounter (HOSPITAL_COMMUNITY): Payer: Self-pay

## 2012-05-06 ENCOUNTER — Inpatient Hospital Stay (HOSPITAL_COMMUNITY)
Admission: AD | Admit: 2012-05-06 | Discharge: 2012-05-15 | DRG: 885 | Disposition: A | Discharge status: Home / Self Care | Payer: Medicare Other | Source: Other Acute Inpatient Hospital | Attending: Psychiatry | Admitting: Psychiatry

## 2012-05-06 DIAGNOSIS — F411 Generalized anxiety disorder: Secondary | ICD-10-CM | POA: Diagnosis present

## 2012-05-06 DIAGNOSIS — Z23 Encounter for immunization: Secondary | ICD-10-CM

## 2012-05-06 DIAGNOSIS — F339 Major depressive disorder, recurrent, unspecified: Secondary | ICD-10-CM

## 2012-05-06 DIAGNOSIS — K219 Gastro-esophageal reflux disease without esophagitis: Secondary | ICD-10-CM | POA: Diagnosis present

## 2012-05-06 DIAGNOSIS — M549 Dorsalgia, unspecified: Secondary | ICD-10-CM | POA: Diagnosis present

## 2012-05-06 DIAGNOSIS — R45851 Suicidal ideations: Secondary | ICD-10-CM

## 2012-05-06 DIAGNOSIS — E7800 Pure hypercholesterolemia, unspecified: Secondary | ICD-10-CM | POA: Diagnosis present

## 2012-05-06 DIAGNOSIS — F419 Anxiety disorder, unspecified: Secondary | ICD-10-CM

## 2012-05-06 DIAGNOSIS — F332 Major depressive disorder, recurrent severe without psychotic features: Principal | ICD-10-CM | POA: Diagnosis present

## 2012-05-06 HISTORY — DX: Dorsalgia, unspecified: M54.9

## 2012-05-06 HISTORY — DX: Depression, unspecified: F32.A

## 2012-05-06 HISTORY — DX: Pure hypercholesterolemia, unspecified: E78.00

## 2012-05-06 HISTORY — DX: Gastro-esophageal reflux disease without esophagitis: K21.9

## 2012-05-06 HISTORY — DX: Schizoaffective disorder, unspecified: F25.9

## 2012-05-06 HISTORY — DX: Major depressive disorder, single episode, unspecified: F32.9

## 2012-05-06 MED ORDER — BUPROPION HCL ER (XL) 300 MG PO TB24
300.0000 mg | ORAL_TABLET | Freq: Every day | ORAL | Status: DC
Start: 2012-05-07 — End: 2012-05-09
  Administered 2012-05-07 – 2012-05-09 (×3): 300 mg via ORAL
  Filled 2012-05-06 (×4): qty 1

## 2012-05-06 MED ORDER — BUPROPION HCL ER (XL) 300 MG PO TB24: 300.0000 mg | ORAL_TABLET | Freq: Every day | ORAL | Status: DC

## 2012-05-06 MED ORDER — BUPROPION HCL ER (SR) 150 MG PO TB12
300.0000 mg | ORAL_TABLET | Freq: Two times a day (BID) | ORAL | Status: DC
  Administered 2012-05-06: 300 mg via ORAL
  Filled 2012-05-06 (×3): qty 2

## 2012-05-06 MED ORDER — GABAPENTIN 300 MG PO CAPS
300.0000 mg | ORAL_CAPSULE | Freq: Every day | ORAL | Status: DC
  Administered 2012-05-06 – 2012-05-14 (×9): 300 mg via ORAL
  Filled 2012-05-06 (×12): qty 1

## 2012-05-06 MED ORDER — ATORVASTATIN CALCIUM 20 MG PO TABS
20.0000 mg | ORAL_TABLET | Freq: Every day | ORAL | Status: DC
  Administered 2012-05-06 – 2012-05-14 (×9): 20 mg via ORAL
  Filled 2012-05-06 (×12): qty 1

## 2012-05-06 MED ORDER — ALUM & MAG HYDROXIDE-SIMETH 200-200-20 MG/5ML PO SUSP
30.0000 mL | ORAL | Status: DC | PRN
  Administered 2012-05-09: 30 mL via ORAL

## 2012-05-06 MED ORDER — IBUPROFEN 600 MG PO TABS
600.0000 mg | ORAL_TABLET | Freq: Three times a day (TID) | ORAL | Status: DC | PRN
  Administered 2012-05-06 – 2012-05-15 (×7): 600 mg via ORAL
  Filled 2012-05-06 (×7): qty 1

## 2012-05-06 MED ORDER — ARIPIPRAZOLE 15 MG PO TABS
15.0000 mg | ORAL_TABLET | Freq: Every day | ORAL | Status: DC
  Administered 2012-05-07 – 2012-05-10 (×4): 15 mg via ORAL
  Filled 2012-05-06 (×5): qty 1

## 2012-05-06 MED ORDER — PAROXETINE HCL 20 MG PO TABS
20.0000 mg | ORAL_TABLET | Freq: Every day | ORAL | Status: DC
  Filled 2012-05-06: qty 1

## 2012-05-06 MED ORDER — ACETAMINOPHEN 325 MG PO TABS
650.0000 mg | ORAL_TABLET | Freq: Four times a day (QID) | ORAL | Status: DC | PRN
Start: 2012-05-06 — End: 2012-05-15

## 2012-05-06 MED ORDER — FAMCICLOVIR 500 MG PO TABS
250.0000 mg | ORAL_TABLET | Freq: Every day | ORAL | Status: DC
  Administered 2012-05-06 – 2012-05-14 (×9): 250 mg via ORAL
  Filled 2012-05-06 (×12): qty 0.5

## 2012-05-06 MED ORDER — MAGNESIUM CITRATE PO SOLN
0.5000 | Freq: Once | ORAL | Status: AC
  Administered 2012-05-06: 0.5 via ORAL

## 2012-05-06 MED ORDER — ARIPIPRAZOLE 15 MG PO TABS
30.0000 mg | ORAL_TABLET | Freq: Every day | ORAL | Status: DC
  Administered 2012-05-06: 30 mg via ORAL
  Filled 2012-05-06 (×2): qty 2

## 2012-05-06 MED ORDER — LURASIDONE HCL 40 MG PO TABS
40.0000 mg | ORAL_TABLET | Freq: Every day | ORAL | Status: DC
  Administered 2012-05-06 – 2012-05-07 (×2): 40 mg via ORAL
  Filled 2012-05-06 (×3): qty 1

## 2012-05-06 MED ORDER — PANTOPRAZOLE SODIUM 40 MG PO TBEC
40.0000 mg | DELAYED_RELEASE_TABLET | Freq: Every day | ORAL | Status: DC
  Administered 2012-05-06 – 2012-05-15 (×10): 40 mg via ORAL
  Filled 2012-05-06 (×13): qty 1

## 2012-05-06 MED ORDER — CLONAZEPAM 1 MG PO TABS
1.0000 mg | ORAL_TABLET | Freq: Three times a day (TID) | ORAL | Status: DC
  Administered 2012-05-06 – 2012-05-08 (×6): 1 mg via ORAL
  Filled 2012-05-06 (×6): qty 1

## 2012-05-06 MED ORDER — TRAZODONE HCL 50 MG PO TABS
50.0000 mg | ORAL_TABLET | Freq: Every evening | ORAL | Status: DC | PRN
  Administered 2012-05-06 – 2012-05-14 (×6): 50 mg via ORAL
  Filled 2012-05-06 (×5): qty 1

## 2012-05-06 MED ORDER — INFLUENZA VIRUS VACC SPLIT PF IM SUSP
0.5000 mL | Freq: Once | INTRAMUSCULAR | Status: AC
  Administered 2012-05-06: 0.5 mL via INTRAMUSCULAR

## 2012-05-06 MED ORDER — POLYETHYLENE GLYCOL 3350 17 G PO PACK
17.0000 g | PACK | ORAL | Status: DC
  Administered 2012-05-06 – 2012-05-12 (×4): 17 g via ORAL
  Filled 2012-05-06 (×7): qty 1

## 2012-05-06 MED ORDER — PAROXETINE HCL 30 MG PO TABS
30.0000 mg | ORAL_TABLET | Freq: Every day | ORAL | Status: DC
  Administered 2012-05-06 – 2012-05-14 (×9): 30 mg via ORAL
  Filled 2012-05-06 (×2): qty 1
  Filled 2012-05-06: qty 3
  Filled 2012-05-06 (×9): qty 1

## 2012-05-06 MED ORDER — MAGNESIUM HYDROXIDE 400 MG/5ML PO SUSP
30.0000 mL | Freq: Every day | ORAL | Status: DC | PRN
  Administered 2012-05-07: 30 mL via ORAL

## 2012-05-06 NOTE — Tx Team (Signed)
Initial Interdisciplinary Treatment Plan  PATIENT STRENGTHS: (choose at least two) Ability for insight Average or above average intelligence  PATIENT STRESSORS: Loss of step father and during the holiday season pt reports feeling increased depression*   PROBLEM LIST: Problem List/Patient Goals Date to be addressed Date deferred Reason deferred Estimated date of resolution  SI to cut carotid artery with razor      Depression                                                 DISCHARGE CRITERIA:  Ability to meet basic life and health needs Adequate post-discharge living arrangements Improved stabilization in mood, thinking, and/or behavior Need for constant or close observation no longer present Reduction of life-threatening or endangering symptoms to within safe limits Safe-care adequate arrangements made Verbal commitment to aftercare and medication compliance  PRELIMINARY DISCHARGE PLAN: Return to previous living arrangement  PATIENT/FAMIILY INVOLVEMENT: This treatment plan has been presented to and reviewed with the patient, [name], and/or family member.  The patient and family have been given the opportunity to ask questions and make suggestions.  [name] 05/06/2012, 3:49 AM

## 2012-05-06 NOTE — Progress Notes (Signed)
Psychoeducational Group Note  Date:  05/06/2012 Time:  1515  Group Topic/Focus:  Conflict Resolution:   The focus of this group is to discuss the conflict resolution process and how it may be used upon discharge.  Participation Level:  Did Not Attend  Participation Quality:    Affect:    Cognitive:    Insight:    Engagement in Group:    Additional Comments:  Pt refused to attend, pt was sleeping.  Isla Pence M 05/06/2012, 2:11 PM

## 2012-05-06 NOTE — [facility] Suicide Risk Assessment (Signed)
Suicide Risk Assessment  Admission Assessment     Nursing information obtained from:  Patient Demographic factors:  Caucasian;Unemployed Current Mental Status:  Suicidal ideation indicated by patient;Suicide plan;Self-harm thoughts;Belief that plan would result in death Loss Factors:  Loss of significant relationship;Financial problems / change in socioeconomic status Historical Factors:  Prior suicide attempts;Anniversary of important loss Risk Reduction Factors:  Religious beliefs about death;Positive social support;Positive therapeutic relationship  CLINICAL FACTORS:   Depression:   Insomnia Severe  COGNITIVE FEATURES THAT CONTRIBUTE TO RISK:  Closed-mindedness Thought constriction (tunnel vision)    SUICIDE RISK:   Severe:  Frequent, intense, and enduring suicidal ideation, specific plan, no subjective intent, but some objective markers of intent (i.e., choice of lethal method), the method is accessible, some limited preparatory behavior, evidence of impaired self-control, severe dysphoria/symptomatology, multiple risk factors present, and few if any protective factors, particularly a lack of social support.  PLAN OF CARE: Supportive approach/coping skills/she can contract for safety in the unit                               Reassess her medications   [name] A 05/06/2012, 3:11 PM

## 2012-05-06 NOTE — Progress Notes (Signed)
Patient admitted involuntarily for SI to use razor to cut her carotid artery. Patient reports that during the holiday season she becomes more depressed and this is also the anniversary of her step-fathers death. Patient reports that she currently lives at [facility].  Patient was pleasant and cooperative during admission.Patient denies hi/a/v hallucinations but reports passive si and verbally contracts for safety. Medical hx include depression, schizoaffective d/o,  gerd, lower back pain and high cholesterol.  Patient has had previous suicide attempts. Patient was oriented to unit, offered meal but she declined. Safety maintained with 15 min checks, will continue to monitor.

## 2012-05-06 NOTE — H&P (Signed)
[name] is an 46 y.o. female.   Chief Complaint: Major depression, suicidal HPI: Patient's depression increased and she became suicidal  Past Medical History  Diagnosis Date  . GERD (gastroesophageal reflux disease)   . Back pain   . Depression   . Schizoaffective disorder   . High cholesterol     History reviewed. No pertinent past surgical history.  History reviewed. No pertinent family history. Social History:  reports that she has never smoked. She does not have any smokeless tobacco history on file. She reports that she drinks about one ounce of alcohol per week. She reports that she does not use illicit drugs.  Allergies:  Allergies  Allergen Reactions  . Sulfa Antibiotics     Not sure of allergy but happened when she used a topical sulfa cream.    Medications Prior to Admission  Medication Sig Dispense Refill  . ARIPiprazole (ABILIFY) 30 MG tablet Take 30 mg by mouth daily.      Marland Kitchen atorvastatin (LIPITOR) 20 MG tablet Take 20 mg by mouth at bedtime.      Marland Kitchen buPROPion (WELLBUTRIN SR) 150 MG 12 hr tablet Take 300 mg by mouth daily after breakfast. Takes two in the morning and one in early afternoon.      Marland Kitchen buPROPion (ZYBAN) 150 MG 12 hr tablet Take 150 mg by mouth daily at 2 PM daily at 2 PM. Takes two in the morning and one in early afternoon.      . clonazePAM (KLONOPIN) 1 MG tablet Take 1-2 mg by mouth 4 (four) times daily. Takes one tablet in the AM and one at 12 noon then takes two tablets at 3 PM and 2 tablets at bedtime.      . famciclovir (FAMVIR) 250 MG tablet Take 250 mg by mouth at bedtime.      Marland Kitchen ibuprofen (ADVIL,MOTRIN) 600 MG tablet Take 600 mg by mouth 3 (three) times daily as needed.      Marland Kitchen omeprazole (PRILOSEC) 40 MG capsule Take 40 mg by mouth daily.      Marland Kitchen PARoxetine (PAXIL) 20 MG tablet Take 60 mg by mouth at bedtime.       . polyethylene glycol (MIRALAX / GLYCOLAX) packet Take 17 g by mouth every other day.       . magnesium hydroxide (MILK OF MAGNESIA)  400 MG/5ML suspension Take 15 mLs by mouth daily as needed.        No results found for this or any previous visit (from the past 48 hour(s)). No results found.  Review of Systems  Constitutional: Negative.   HENT: Negative.   Eyes: Negative.   Respiratory: Negative.   Cardiovascular: Negative.   Gastrointestinal: Positive for constipation.  Genitourinary: Negative.   Musculoskeletal: Negative.   Skin: Negative.   Neurological: Negative.   Endo/Heme/Allergies: Negative.   Psychiatric/Behavioral: Positive for depression and suicidal ideas.    Blood pressure 116/69, pulse 85, temperature 97.9 F (36.6 C), temperature source Oral, resp. rate 18, height 5' (1.524 m), weight 69.854 kg (154 lb), last menstrual period 04/23/2012. Physical Exam  Constitutional: She is oriented to person, place, and time. She appears well-developed and well-nourished.  HENT:  Head: Normocephalic.  Right Ear: External ear normal.  Left Ear: External ear normal.  Nose: Nose normal.  Mouth/Throat: Oropharynx is clear and moist.  Eyes: Conjunctivae normal and EOM are normal. Pupils are equal, round, and reactive to light.  Neck: Normal range of motion. Neck supple.  Cardiovascular: Normal rate,  regular rhythm, normal heart sounds and intact distal pulses.   Respiratory: Effort normal and breath sounds normal.  GI: Soft. Bowel sounds are normal.  Genitourinary:       Exam deferred, no complaints or issues  Musculoskeletal: Normal range of motion.  Neurological: She is alert and oriented to person, place, and time. She has normal reflexes.  Skin: Skin is warm and dry.    Assessment/Plan Patient complained of constipation and reported she takes Miralax every other day but if she takes it daily she gets "seepage", she reported still having issues with constipation and "usually does enemas", encouraged patient to use magnesium citrate PRN if the Miralax did not work, Sidra also complained of pain at times  related to a diagnosis of scoliosis- lower back pain, stated ibuprofen works for order--order in place.  Nanine Means, PMH-NP 05/06/2012, 12:38 PM

## 2012-05-06 NOTE — BH Assessment (Signed)
Assessment Note   [name] is an 46 y.o. female. Per [facility] assessment patient has history of depression with several suicidal attempts since age 71( took rat poison and overdosed on over the counter medications). Depression gets worse around the holidays. "I thought I was getting better." Depression got worse over the past weeks..." began having suicidal thoughts of breaking a razor and cutting her carotid arteries. Patient has been accepted for inpatient crisis stabilization.  Axis I: Major Depression, Recurrent severe, Social Phobia Axis II: Deferred Axis III:  Past Medical History  Diagnosis Date  . GERD (gastroesophageal reflux disease)   . Back pain   . Depression   . Schizoaffective disorder   . High cholesterol    Axis IV: housing problems, other psychosocial or environmental problems and problems related to social environment Axis V: 30  Past Medical History:  Past Medical History  Diagnosis Date  . GERD (gastroesophageal reflux disease)   . Back pain   . Depression   . Schizoaffective disorder   . High cholesterol     History reviewed. No pertinent past surgical history.  Family History: History reviewed. No pertinent family history.  Social History:  reports that she has never smoked. She does not have any smokeless tobacco history on file. She reports that she drinks about one ounce of alcohol per week. She reports that she does not use illicit drugs.  Additional Social History:  Alcohol / Drug Use History of alcohol / drug use?: Yes (pt reports occassional drink once a month sometimes with a m)  CIWA:   COWS:    Allergies:  Allergies  Allergen Reactions  . Sulfa Antibiotics     Home Medications:  Medications Prior to Admission  Medication Sig Dispense Refill  . ARIPiprazole (ABILIFY) 30 MG tablet Take 30 mg by mouth daily.      Marland Kitchen atorvastatin (LIPITOR) 20 MG tablet Take 20 mg by mouth at bedtime.      Marland Kitchen buPROPion (WELLBUTRIN XL) 300  MG 24 hr tablet Take 300 mg by mouth 2 (two) times daily.      . famciclovir (FAMVIR) 250 MG tablet Take 250 mg by mouth at bedtime.      Marland Kitchen ibuprofen (ADVIL,MOTRIN) 600 MG tablet Take 600 mg by mouth 3 (three) times daily as needed.      Marland Kitchen omeprazole (PRILOSEC) 40 MG capsule Take 40 mg by mouth daily.      Marland Kitchen PARoxetine (PAXIL) 20 MG tablet Take 20 mg by mouth at bedtime.      . polyethylene glycol (MIRALAX / GLYCOLAX) packet Take 17 g by mouth daily.      . clonazePAM (KLONOPIN) 1 MG tablet Take 1 mg by mouth at bedtime.      . magnesium hydroxide (MILK OF MAGNESIA) 400 MG/5ML suspension Take 15 mLs by mouth daily as needed.        OB/GYN Status:  Patient's last menstrual period was 04/23/2012.  General Assessment Data Location of Assessment: [facility] Assessment Services Living Arrangements: Other (Comment) ([place]) Can pt return to current living arrangement?: Yes Admission Status: Involuntary Is patient capable of signing voluntary admission?: No Transfer from: Acute Hospital Referral Source: Other [facility])  Education Status Is patient currently in school?: No Current Grade:  (Na) Highest grade of school patient has completed:  (BS in Education) Name of school:  (Na) Contact person:  Malachi Bonds Martin/ mother)  Risk to self Suicidal Ideation: Yes-Currently Present Suicidal Intent: Yes-Currently Present Is patient  at risk for suicide?: Yes Suicidal Plan?: Yes-Currently Present Specify Current Suicidal Plan:  (Cut her carotids with a razor blade) Access to Means: Yes Specify Access to Suicidal Means:  (razor blades) What has been your use of drugs/alcohol within the last 12 months?:  (Denies current use) Previous Attempts/Gestures: Yes How many times?:  (multiple attempts) Other Self Harm Risks:  (None  noted) Triggers for Past Attempts: Other (Comment) (Holidays) Intentional Self Injurious Behavior: None Family Suicide History: No (Father/ depression) Recent  stressful life event(s): Other (Comment) (Holidays) Persecutory voices/beliefs?: No Depression: Yes Depression Symptoms: Feeling worthless/self pity;Fatigue;Isolating;Feeling angry/irritable;Loss of interest in usual pleasures (Crying spells) Substance abuse history and/or treatment for substance abuse?: No Suicide prevention information given to non-admitted patients: Not applicable  Risk to Others Homicidal Ideation: No Thoughts of Harm to Others: No Current Homicidal Intent: No Current Homicidal Plan: No Access to Homicidal Means: No Identified Victim:  (Na) History of harm to others?: No Assessment of Violence: None Noted Violent Behavior Description:  (Na) Does patient have access to weapons?: No Criminal Charges Pending?: No Does patient have a court date: No  Psychosis Hallucinations: None noted Delusions: None noted  Mental Status Report Appear/Hygiene:  (WNL) Eye Contact: Good Motor Activity: Freedom of movement;Unremarkable Speech: Logical/coherent Level of Consciousness: Alert Mood: Depressed Affect: Blunted Anxiety Level: Moderate Thought Processes: Coherent;Relevant Judgement: Impaired Orientation: Person;Place;Time;Situation Obsessive Compulsive Thoughts/Behaviors: Moderate  Cognitive Functioning Concentration: Decreased Memory: Recent Intact;Remote Intact IQ: Average Insight: Poor Impulse Control: Poor Appetite: Good Weight Loss:  (Na) Weight Gain:  (Na) Sleep: Decreased Total Hours of Sleep:  (6 hours) Vegetative Symptoms: None  ADLScreening [facility] Assessment Services) Patient's cognitive ability adequate to safely complete daily activities?: Yes Patient able to express need for assistance with ADLs?: No Independently performs ADLs?: Yes (appropriate for developmental age)  Abuse/Neglect [facility]) Physical Abuse: Denies Verbal Abuse: Denies Sexual Abuse: Denies  Prior Inpatient Therapy Prior Inpatient Therapy: Yes Prior Therapy Dates:   (65,2000, age 67) Prior Therapy Facilty/Provider(s):  ([facility], [facility], multiple admissions) Reason for Treatment:  (SI, Depression)  Prior Outpatient Therapy Prior Outpatient Therapy: Yes Prior Therapy Dates:  (Current) Prior Therapy Facilty/Provider(s):  [facility]) Reason for Treatment:  (MDD, Social phobia)  ADL Screening (condition at time of admission) Patient's cognitive ability adequate to safely complete daily activities?: Yes Patient able to express need for assistance with ADLs?: No Independently performs ADLs?: Yes (appropriate for developmental age)       Abuse/Neglect Assessment (Assessment to be complete while patient is alone) Physical Abuse: Denies Verbal Abuse: Denies Sexual Abuse: Denies Exploitation of patient/patient's resources: Denies Self-Neglect: Denies     Merchant navy officer (For Healthcare) Advance Directive: Patient does not have advance directive Pre-existing out of facility DNR order (yellow form or pink MOST form): No    Additional Information 1:1 In Past 12 Months?: No CIRT Risk: No Elopement Risk: No Does patient have medical clearance?: Yes     Disposition:  Disposition Disposition of Patient: Inpatient treatment program Type of inpatient treatment program: Adult  On Site Evaluation by:   Reviewed with Physician:  Nanine Means NP   Ardelia Mems M 05/06/2012 3:29 AM

## 2012-05-06 NOTE — Progress Notes (Signed)
Psychoeducational Group Note  Date:  05/06/2012 Time:  1015  Group Topic/Focus:  Making Healthy Choices:   The focus of this group is to help patients identify negative/unhealthy choices they were using prior to admission and identify positive/healthier coping strategies to replace them upon discharge.  Participation Level:  Active  Participation Quality:  Appropriate  Affect:  Appropriate  Cognitive:  Appropriate  Insight:  Good  Engagement in Group:  Good  Additional Comments:    Sara Keys A 05/06/2012  

## 2012-05-06 NOTE — H&P (Signed)
Psychiatric Admission Assessment Adult  Patient Identification:  [name] Date of Evaluation:  05/06/2012 Chief Complaint:  296.33 Major Depressive Disorder, Recurrent, Severe Without Psychotic Features History of Present Illness:: Has been struggling with depression and suicidal ideas for a long time. First time OD she was 15. A week ago the thoughts became more intense ,she was contemplating how to do it. Endorses possible triggers: parents wedding anniversary, death of father who raised her. The holidays are bad time for her. There is not neccessary a trigger, holidays are usually bad. Currently suicidal ideas: use a razor in carotid artery at 2 AM. Post  Partum Depression, Seasonal component Mood Symptoms:  Anhedonia, Depression, Energy, Helplessness, Hopelessness, Psychomotor Retardation, Sadness, SI, Worthlessness, Depression Symptoms:  depressed mood, anhedonia, psychomotor retardation, fatigue, feelings of worthlessness/guilt, difficulty concentrating, hopelessness, recurrent thoughts of death, suicidal thoughts with specific plan, loss of energy/fatigue, Crying spells (Hypo) Manic Symptoms:  Denies Anxiety Symptoms:  Excessive Worry, Psychotic Symptoms:  Denies  PTSD Symptoms:Denies   Past Psychiatric History: Diagnosis: Major Depression , recurrent severe  Hospitalizations:[facility] (X2), from there assisted living facilities. [facility] three times, [facility] [place], the [place], [facility]  Outpatient Care: [facility] Has had ECT X 4  Substance Abuse Care:Denies  Self-Mutilation: scratched /bang head against the wall  Suicidal Attempts: four or five (OD)  Violent Behaviors:Denies   Past Medical History:   Past Medical History  Diagnosis Date  . GERD (gastroesophageal reflux disease)   . Back pain   . Depression   . Schizoaffective disorder   . High cholesterol     Allergies:   Allergies  Allergen Reactions  . Sulfa  Antibiotics     Not sure of allergy but happened when she used a topical sulfa cream.   PTA Medications: Prescriptions prior to admission  Medication Sig Dispense Refill  . ARIPiprazole (ABILIFY) 30 MG tablet Take 30 mg by mouth daily.      Marland Kitchen atorvastatin (LIPITOR) 20 MG tablet Take 20 mg by mouth at bedtime.      Marland Kitchen buPROPion (WELLBUTRIN SR) 150 MG 12 hr tablet Take 300 mg by mouth daily after breakfast. Takes two in the morning and one in early afternoon.      Marland Kitchen buPROPion (ZYBAN) 150 MG 12 hr tablet Take 150 mg by mouth daily at 2 PM daily at 2 PM. Takes two in the morning and one in early afternoon.      . clonazePAM (KLONOPIN) 1 MG tablet Take 1-2 mg by mouth 4 (four) times daily. Takes one tablet in the AM and one at 12 noon then takes two tablets at 3 PM and 2 tablets at bedtime.      . famciclovir (FAMVIR) 250 MG tablet Take 250 mg by mouth at bedtime.      Marland Kitchen ibuprofen (ADVIL,MOTRIN) 600 MG tablet Take 600 mg by mouth 3 (three) times daily as needed.      Marland Kitchen omeprazole (PRILOSEC) 40 MG capsule Take 40 mg by mouth daily.      Marland Kitchen PARoxetine (PAXIL) 20 MG tablet Take 20 mg by mouth at bedtime.      . polyethylene glycol (MIRALAX / GLYCOLAX) packet Take 17 g by mouth every other day.       . magnesium hydroxide (MILK OF MAGNESIA) 400 MG/5ML suspension Take 15 mLs by mouth daily as needed.        Previous Psychotropic Medications:  Medication/Dose  Wellbutrin  Prozac, Zoloft, Paxil, Effexor, Luvox, Abilify, Seroquel,  Risperdal(EPS)   Depakote, Lithium, Lamictal, Tegretol, Imipramine           Substance Abuse History in the last 12 months: Substance Age of 1st Use Last Use Amount Specific Type  Nicotine  N/A    Alcohol 22 Occasionally once every couple of months Binges when medications are not working 2 drinks   Cannabis  One time at 20    Opiates      Cocaine      Methamphetamines      LSD      Ecstasy      Benzodiazepines      Caffeine      Inhalants      Others:                          Consequences of Substance Abuse: Legal Consequences:  DWI X 1 2007  Social History: Current Place of Residence:  Assisted Living Facilities (3 1/2 years) Place of Birth:   Family Members: Marital Status:  Separated Children:  Sons:21 Y/O  Daughters: Relationships: Education:  College/therapeutic recreation Educational Problems/Performance: Religious Beliefs/Practices: Has been not currently History of Abuse (Emotional/Phsycial/Sexual) Denies Occupational Experiences; substitute teach/on Actuary History:  None. Legal History: Hobbies/Interests:  Family History:  History reviewed. No pertinent family history.Father takes Effexor, Grandmothers depressed  Mental Status Examination/Evaluation: Objective:  Appearance: Fairly Groomed  Patent attorney::  Fair  Speech:  Clear and Coherent, Slow and not spontaneous  Volume:  Decreased  Mood:  Depressed  Affect:  Restricted  Thought Process:  Coherent and Goal Directed  Orientation:  Full  Thought Content:  symptoms, previous treatment attempts  Suicidal Thoughts:  Yes.  with intent/plan  Homicidal Thoughts:  No  Memory:  Immediate;   Fair Recent;   Fair Remote;   Fair  Judgement:  Fair  Insight:  Present  Psychomotor Activity:  Decreased  Concentration:  Fair  Recall:  Fair  Akathisia:  No  Handed:  Right  AIMS (if indicated):     Assets:  Communication Skills Desire for Improvement Housing  Sleep:  Number of Hours: 1.25     Laboratory/X-Ray Psychological Evaluation(s)      Assessment:    AXIS I:  Major Depression, recurrent, severe R/O Bipolar AXIS II:  Deferred AXIS III:   Past Medical History  Diagnosis Date  . GERD (gastroesophageal reflux disease)   . Back pain   . Depression   . Schizoaffective disorder   . High cholesterol    AXIS IV:  consequence of her illness AXIS V:  41-50 serious symptoms  Treatment Plan/Recommendations:  Treatment Plan Summary: Daily contact with  patient to assess and evaluate symptoms and progress in treatment Medication management Current Medications:  Current Facility-Administered Medications  Medication Dose Route Frequency Provider Last Rate Last Dose  . acetaminophen (TYLENOL) tablet 650 mg  650 mg Oral Q6H PRN Nanine Means, NP      . alum & mag hydroxide-simeth (MAALOX/MYLANTA) 200-200-20 MG/5ML suspension 30 mL  30 mL Oral Q4H PRN Nanine Means, NP      . ARIPiprazole (ABILIFY) tablet 30 mg  30 mg Oral Daily Nanine Means, NP   30 mg at 05/06/12 0823  . atorvastatin (LIPITOR) tablet 20 mg  20 mg Oral q1800 Nanine Means, NP      . buPROPion [facility] SR) 12 hr tablet 300 mg  300 mg Oral BID Nanine Means, NP   300 mg at 05/06/12 0823  . famciclovir [facility])  tablet 250 mg  250 mg Oral QHS Nanine Means, NP      . ibuprofen (ADVIL,MOTRIN) tablet 600 mg  600 mg Oral TID PRN Nanine Means, NP   600 mg at 05/06/12 0403  . influenza  inactive virus vaccine (FLUZONE/FLUARIX) injection 0.5 mL  0.5 mL Intramuscular Once Himabindu Ravi, MD      . magnesium hydroxide (MILK OF MAGNESIA) suspension 30 mL  30 mL Oral Daily PRN Nanine Means, NP      . pantoprazole (PROTONIX) EC tablet 40 mg  40 mg Oral Daily Nanine Means, NP   40 mg at 05/06/12 0823  . PARoxetine (PAXIL) tablet 20 mg  20 mg Oral QHS Nanine Means, NP      . traZODone (DESYREL) tablet 50 mg  50 mg Oral QHS PRN Nanine Means, NP        Observation Level/Precautions:  AWOL  Labs: As per ED  Psychotherapy:  Individual/group/coping skills  Medications:  Reassess medications  Routine PRN Medications:  Yes  Consultations:    Discharge Concerns:    Other:  Can contract for safety in the hospital   Lorrayne Ismael A 12/1/201310:19 AM

## 2012-05-06 NOTE — Progress Notes (Addendum)
D) Pt has been sleeping this morning because "I got here at 4 am this morning". Did get up for her medications. Rates her depression at a 10 and her helplessness at a 9. Denies SI and HI. Pt. States that she is depressed and whenever she comes to the med room with a concern has a  has a smile or a smirk on her face after the statement is made. Affect is incongruent with the information being spoken. After a small little exercise in group, Pt states that she still wanted to die and maintained the same smile. A) Encouraged to rest a little this morning since Pt was up all night. Given support and reassurance when appropriate. Provided with a brief 1:1. Medication teaching done with Pt's new medications that she will receive today.  R) Pt denies SI on paper, but verbally reports that she still wanted to die in group. Affect remains inappropriate at times.

## 2012-05-06 NOTE — Clinical Social Work Note (Signed)
[facility] Group Notes:  (Clinical Social Work)  05/06/2012   3:00-4:00PM  Summary of Progress/Problems:   The main focus of today's process group was for the patient to define "support" and describe what healthy supports are, then to identify the patient's current support system and decide on other supports that can be put in place to prevent future hospitalizations.  An emphasis was placed on using therapist, doctor and problem-specific support groups to expand supports. The patient expressed that her ACT Team ([facility]), her parents, her brother, and her assisted living facility staff are all supportive of her.  She has done the [facility] program in the past, but is interested in possibly adding other [facility] classes to her support system.  Type of Therapy:  Process Group  Participation Level:  Active  Participation Quality:  Appropriate, Attentive and Sharing  Affect:  Blunted and Depressed  Cognitive:  Alert and Oriented  Insight:  Good  Engagement in Group:  Good  Engagement in Therapy:  Good  Modes of Intervention:  Clarification, Education, Limit-setting, Problem-solving, Socialization, Support and Processing   Ambrose Mantle, LCSW 05/06/2012,

## 2012-05-07 NOTE — Progress Notes (Signed)
[facility] Adult Inpatient Family/Significant Other Suicide Prevention Education  Suicide Prevention Education:  Education Completed; 05/07/2012 with assisted living home worker Christy Sartorius at [phone] has been identified by the patient as the family member/significant other with whom the patient will be residing, and identified as the person(s) who will aid the patient in the event of a mental health crisis (suicidal ideations/suicide attempt).  With written consent from the patient, the family member/significant other has been provided the following suicide prevention education, prior to the and/or following the discharge of the patient.  The suicide prevention education provided includes the following:  Suicide risk factors  Suicide prevention and interventions  National Suicide Hotline telephone number  [facility] assessment telephone number  [facility] Emergency Assistance 911  [facility] and/or Residential Mobile Crisis Unit telephone number  Request made of family/significant other to:  Remove weapons (e.g., guns, rifles, knives), all items previously/currently identified as safety concern.    Remove drugs/medications (over-the-counter, prescriptions, illicit drugs), all items previously/currently identified as a safety concern.  The family member/significant other verbalizes understanding of the suicide prevention education information provided.  The family member/significant other agrees to remove the items of safety concern listed above.  [name] 05/07/2012, 4:16 PM

## 2012-05-07 NOTE — Progress Notes (Signed)
D: Pt complains of depression.  Affect is blunted/depressed; mood depressed.  Interaction is cautious but Pt appears forthcoming in response to questions with fleeting eye contact.  Facial expression is flat, sad, and at times incongruent with thought process with smiling during extremely serious topics of conversation (e.g., slitting carotid artery).  Speech is logical/coherent with decreased volume, often speaks with hands in front of face.  Pt has been cooperative, attended most groups despite arriving on unit at approximately 0400 on 05/06/2012.  Thought process and content appear intact.  Pt endorses passive SI on unit ("would be more serious if I wasn't here") but then states that her suicidal statements to mother and ED staff were outbursts that she didn't actually mean.  Denies HI, AVH, and acute pain.  Contracting for safety on unit with explicit instructions to immediately approach staff if ideation becomes more intense.  A: During 1:1, Pt requested to "tell my story".  Writer participated actively and provided support and encouragement, praised Pt for choosing not to harm self.  Pt "story" contained many inconsistencies regarding suicidality and events of past few days, but Pt clear that she is relieved to be at [facility] and "unable to hurt myself".  Given Trazodone 50mg  PO for insomnia @2116 .  All medications administered according to med orders and POC.  Q15 minute safety checks maintained as per unit protocol.  R: Pt relieved to have responsibility for own safety lifted temporarily; "needs" time to think.  Safety maintained. Dion Saucier RN

## 2012-05-07 NOTE — Progress Notes (Addendum)
D:  Patient's self inventory sheet, patient sleeps well, has good appetite, low energy level, poor attention span.  Rated depression, hopelessness and anxiety as #8.  Denied withdrawals.  SI, contracts for safety.  Patient complains of lack of energy.  No BM in 4 days.  Zero pain goal.  Worst pain #8.  Not sure how to better care for herself after discharge.  No questions for staff.  Will return to group home assisted living after discharge.  No problems taking medications after discharge. A:  Medications administered per MD order.  Support and encouragement given throughout day.  Support and safety checks completed as ordered. R:  Following treatment plan.  Denied HI.  Denied A/V hallucinations.  SI, contracts for safety.  Patient remains safe and receptive on unit Patient attended and participated in group this morning. Patient was given MOM for constipation.   Stated she did have a small BM this morning.

## 2012-05-07 NOTE — Progress Notes (Signed)
[facility] Group Notes:  (Counselor/Nursing/MHT/Case Management/Adjunct)  05/07/2012 1:36 AM  Type of Therapy:  Psychoeducational Skills  Participation Level:  Minimal  Participation Quality:  Appropriate  Affect:  Appropriate  Cognitive:  Appropriate  Insight:  Limited  Engagement in Group:  Limited  Engagement in Therapy:  Limited  Modes of Intervention:  Education  Summary of Progress/Problems: The patient described her day as being "ok". She had little to contribute to group except to mention that she was unable to rest during the day time. Her goal for tomorrow is to try "to make it through the day".    Magaret Justo S 05/07/2012, 1:36 AM

## 2012-05-07 NOTE — Progress Notes (Signed)
Psychoeducational Group Note  Date:  05/07/2012 Time:  1100a  Group Topic/Focus:  Self Care:   The focus of this group is to help patients understand the importance of self-care in order to improve or restore emotional, physical, spiritual, interpersonal, and financial health.  Participation Level:  Did Not Attend   Meredith Staggers 05/07/2012, 2:23 PM

## 2012-05-07 NOTE — Social Work (Signed)
Interdisciplinary Treatment Plan Update (Adult)  Date:  05/07/2012  Time Reviewed:  7:06 AM    Progress in Treatment: Attending groups:   Yes   Participating in groups:  Yes Taking medication as prescribed:  Yes Tolerating medication:  Yes Family/Significant othe contact made: Not currently Patient understands diagnosis:  Yes Discussing patient identified problems/goals with staff: Yes Medical problems stabilized or resolved: Yes Denies suicidal/homicidal ideation: No -  Patient able to contract for safety Issues/concerns per patient self-inventory:  Other:  New problem(s) identified: Patient will need a new FL 2  Reason for Continuation of Hospitalization: Anxiety Depression Medication stabilization Suicidal ideation  Interventions implemented related to continuation of hospitalization:  Medication mgement; safety checks q 15 mins; coping skills development  Additional comments: Patient reports being in and out of hospitals since the age of 67. Patient was unable to list stressors for current suicidal ideation with plan to cut her throat. Patient lives in an assisted-living facility through [facility] and has an ACT Team  Estimated length of stay: One week  Discharge Plan:  Outpatient follow up to be scheduled  New goal(s):  Review of initial/current patient goals per problem list:    1.  Goal(s): Eliminate SI/other thoughts of self harm   Met:  No  Target date: d/c  As evidenced by: Patient will no longer endorse SI/HI or other thoughts of self harm.    2.  Goal (s):Reduce depression/anxiety  Met: No  Target date: d/c  As evidenced by: Patient will rate symptoms at four or below    3.  Goal(s):.stabilize on meds   Met:  No  Target date: d/c  As evidenced by: Patient will report being stabilized on medications - less symptomatic    4.  Goal(s): Refer for outpatient follow up to be completed   Met:  No  Target date: d/c  As evidenced by:  Follow up appointment will be scheduled    Attendees: Patient:   @TD  7:06 AM  Physican:  Patrick North, MD @TD  7:06 AM  Nursing:   05/07/2012 7:06 AM  Nursing:   Neill Loft, RN  @TD  7:06 AM  Clinical Social Worker:  Patton Salles, LCSW @TD  7:06 AM  Other: Joanell Rising 05/07/2012 7:06 AM   Other:         05/07/2012 7:06 AM Other:

## 2012-05-07 NOTE — Progress Notes (Signed)
[facility] MD Progress Note  05/07/2012 10:40 AM [name]  MRN:  [id number]  S: Patient reports feeling sleepy. Continues to be depressed, ruminates a lot about her childhood issues. Having suicidal thoughts.  Diagnosis:   Axis I: Major Depression, Recurrent severe Axis II: No diagnosis Axis III:  Past Medical History  Diagnosis Date  . GERD (gastroesophageal reflux disease)   . Back pain   . Depression   . Schizoaffective disorder   . High cholesterol    Axis IV: other psychosocial or environmental problems Axis V: 41-50 serious symptoms  ADL's:  Intact  Sleep: Fair  Appetite:  Fair   Mental Status Examination/Evaluation: Objective:  Appearance: Casual  Eye Contact::  Fair  Speech:  Slurred  Volume:  Decreased  Mood:  Depressed and Hopeless  Affect:  Constricted and Restricted  Thought Process:  Coherent  Orientation:  Full  Thought Content:  WDL  Suicidal Thoughts:  Yes.  without intent/plan  Homicidal Thoughts:  No  Memory:  Immediate;   Fair Recent;   Fair Remote;   Fair  Judgement:  Fair  Insight:  Fair  Psychomotor Activity:  Normal  Concentration:  Fair  Recall:  Fair  Akathisia:  No  Handed:  Right  AIMS (if indicated):     Assets:  Communication Skills Desire for Improvement  Sleep:  Number of Hours: 6.75    Vital Signs:Blood pressure 134/74, pulse 89, temperature 97.7 F (36.5 C), temperature source Oral, resp. rate 18, height 5' (1.524 m), weight 69.854 kg (154 lb), last menstrual period 04/23/2012. Current Medications: Current Facility-Administered Medications  Medication Dose Route Frequency Provider Last Rate Last Dose  . acetaminophen (TYLENOL) tablet 650 mg  650 mg Oral Q6H PRN Nanine Means, NP      . alum & mag hydroxide-simeth (MAALOX/MYLANTA) 200-200-20 MG/5ML suspension 30 mL  30 mL Oral Q4H PRN Nanine Means, NP      . ARIPiprazole (ABILIFY) tablet 15 mg  15 mg Oral Daily Rachael Fee, MD   15 mg at 05/07/12 0803  . atorvastatin (LIPITOR)  tablet 20 mg  20 mg Oral q1800 Nanine Means, NP   20 mg at 05/06/12 1810  . buPROPion (WELLBUTRIN XL) 24 hr tablet 300 mg  300 mg Oral Daily Rachael Fee, MD   300 mg at 05/07/12 0903  . clonazePAM (KLONOPIN) tablet 1 mg  1 mg Oral TID AC Rachael Fee, MD   1 mg at 05/07/12 0805  . famciclovir [facility]) tablet 250 mg  250 mg Oral QHS Nanine Means, NP   250 mg at 05/06/12 2117  . gabapentin (NEURONTIN) capsule 300 mg  300 mg Oral QHS Rachael Fee, MD   300 mg at 05/06/12 2116  . ibuprofen (ADVIL,MOTRIN) tablet 600 mg  600 mg Oral TID PRN Nanine Means, NP   600 mg at 05/06/12 0403  . [COMPLETED] influenza  inactive virus vaccine (FLUZONE/FLUARIX) injection 0.5 mL  0.5 mL Intramuscular Once Vinayak Bobier, MD   0.5 mL at 05/06/12 1439  . lurasidone (LATUDA) tablet 40 mg  40 mg Oral QHS Rachael Fee, MD   40 mg at 05/06/12 2117  . [COMPLETED] magnesium citrate solution 0.5 Bottle  0.5 Bottle Oral Once Nanine Means, NP   0.5 Bottle at 05/06/12 1438  . magnesium hydroxide (MILK OF MAGNESIA) suspension 30 mL  30 mL Oral Daily PRN Nanine Means, NP      . pantoprazole (PROTONIX) EC tablet 40 mg  40 mg Oral  Daily Nanine Means, NP   40 mg at 05/07/12 0806  . PARoxetine (PAXIL) tablet 30 mg  30 mg Oral QHS Rachael Fee, MD   30 mg at 05/06/12 2116  . polyethylene glycol (MIRALAX / GLYCOLAX) packet 17 g  17 g Oral QODAY Nanine Means, NP   17 g at 05/06/12 1436  . traZODone (DESYREL) tablet 50 mg  50 mg Oral QHS PRN Nanine Means, NP   50 mg at 05/06/12 2116  . [DISCONTINUED] ARIPiprazole (ABILIFY) tablet 30 mg  30 mg Oral Daily Nanine Means, NP   30 mg at 05/06/12 8469  . [DISCONTINUED] buPROPion (WELLBUTRIN SR) 12 hr tablet 300 mg  300 mg Oral BID Nanine Means, NP   300 mg at 05/06/12 0823  . [DISCONTINUED] buPROPion (WELLBUTRIN XL) 24 hr tablet 300 mg  300 mg Oral Daily Rachael Fee, MD      . [DISCONTINUED] PARoxetine (PAXIL) tablet 20 mg  20 mg Oral QHS Nanine Means, NP        Lab Results: No results  found for this or any previous visit (from the past 48 hour(s)).  Physical Findings: AIMS: Facial and Oral Movements Muscles of Facial Expression: None, normal Lips and Perioral Area: None, normal Jaw: None, normal Tongue: None, normal,Extremity Movements Upper (arms, wrists, hands, fingers): None, normal Lower (legs, knees, ankles, toes): None, normal, Trunk Movements Neck, shoulders, hips: None, normal, Overall Severity Severity of abnormal movements (highest score from questions above): None, normal Incapacitation due to abnormal movements: None, normal Patient's awareness of abnormal movements (rate only patient's report): No Awareness, Dental Status Current problems with teeth and/or dentures?: No Does patient usually wear dentures?: No  CIWA:    COWS:     Treatment Plan Summary: Daily contact with patient to assess and evaluate symptoms and progress in treatment Medication management  Plan: Continue current plan of care. Continue to monitor. Patient willing to sign in voluntarily.  Tennelle Taflinger 05/07/2012, 10:40 AM

## 2012-05-07 NOTE — Progress Notes (Signed)
Patient ID: [name], female   DOB: [birth year], 46 y.o.   MRN: [id number] PER STATE REGULATIONS 482.30  THIS CHART WAS REVIEWED FOR MEDICAL NECESSITY WITH RESPECT TO THE PATIENT'S ADMISSION/ DURATION OF STAY. 12/1-12/08/2011 NEXT REVIEW DATE: 05/09/2012  Willa Rough, RN, BSN CASE MANAGER

## 2012-05-07 NOTE — Social Work (Signed)
Aftercare Planning Group: 05/07/2012 9:45 AM  Pt attended discharge planning group and actively participated in group.  CSW provided pt with today's workbook.  Pt presents with depressed mood and rates herself an 8 across the board for depression, anxiety, hopelessness, and helplessness. Patient reports having a suicidal plan to cut her throat but was unable to list stressors related to her plan. Patient says she will be allowed to go back to her assisted living facility and will need and FL-2.   [facility] Group Note : Clinical Social Worker Group Therapy  05/07/2012  1:15 PM  Type of Therapy:  Group Therapy - Process Group  Participation Level:  Appropriate  Participation Quality:  Appropriate   Affect:  Blunted, Depressed and Flat  Cognitive:  Alert  Insight:  Limited  Engagement in Group:  Limited  Engagement in Therapy:  Limited  Modes of Intervention:  Clarification, Education, Socialization and Support  Summary of Progress/Problems: Patient was attentive to participated minimally in group session focused on overcoming obstacles. Patient reports her biggest obstacles of getting her medications straightened out so she has been depressed for the past 4-5 weeks while doctor's focus on stabilizing her meds. 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 Patient stated she has a stationary bike outside of where she lives that she uses to help keep her mood more level.   Patton Salles, LCSW 05/07/2012 3:00 pm

## 2012-05-08 MED ORDER — CLONAZEPAM 1 MG PO TABS
1.0000 mg | ORAL_TABLET | Freq: Two times a day (BID) | ORAL | Status: DC
  Administered 2012-05-08 – 2012-05-15 (×14): 1 mg via ORAL
  Filled 2012-05-08 (×14): qty 1
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
## 2012-05-08 NOTE — Progress Notes (Addendum)
D:  Patient's self inventory sheet, patient sleeps well, has good appetite, low energy level, poor attention span.  Rated depression #8, hopelessness #7.  SI off/on, contracts for safety.   Constipation in past 24 hours.  Zero pain goal.   Worst pain #8.  After discharge, plans to take medications, maintain routine.   Unsure of discharge plans.  No problems taking medications after discharge. A:  Medications administered per MD order.  Support and encouragement given throughout day.  Support and safety checks completed as ordered. R:  Following treatment plan.  SI off/on, contracts for safety.   Denied HI.  Denied A/V hallucinations.  Patient remains safe and receptive on unit. Patient had BM this morning.  Attending and participating in groups today.  1500  Patient stated she wants to talk to Synetta Fail before the care plan meeting on Wednesday morning.

## 2012-05-08 NOTE — Progress Notes (Signed)
[facility] MD Progress Note  05/08/2012 10:51 AM [name]  MRN:  [id number]  S: Patient continues to be depressed. Feeling drowsy. Having suicidal thoughts on and off all day, states being tired of being depressed, at lack of energy. Not enjoying any activities.  Diagnosis:   Axis I: Major Depression, Recurrent severe Axis II: No diagnosis Axis III:  Past Medical History  Diagnosis Date  . GERD (gastroesophageal reflux disease)   . Back pain   . Depression   . Schizoaffective disorder   . High cholesterol    Axis IV: economic problems and other psychosocial or environmental problems Axis V: 41-50 serious symptoms  ADL's:  Intact  Sleep: Fair  Appetite:  Fair  Mental Status Examination/Evaluation: Objective:  Appearance: Casual  Eye Contact::  Fair  Speech:  Slow  Volume:  Decreased  Mood:  Depressed, ruminative  Affect:  Congruent and Flat  Thought Process:  Coherent  Orientation:  Full  Thought Content:  WDL  Suicidal Thoughts:  Yes.  without intent/plan  Homicidal Thoughts:  No  Memory:  Immediate;   Fair Recent;   Fair Remote;   Fair  Judgement:  Fair  Insight:  Fair  Psychomotor Activity:  Decreased and Psychomotor Retardation  Concentration:  Fair  Recall:  Fair  Akathisia:  No  Handed:  Right  AIMS (if indicated):     Assets:  Communication Skills Desire for Improvement  Sleep:  Number of Hours: 6.5    Vital Signs:Blood pressure 123/73, pulse 99, temperature 98.3 F (36.8 C), temperature source Oral, resp. rate 16, height 5' (1.524 m), weight 69.854 kg (154 lb), last menstrual period 04/23/2012. Current Medications: Current Facility-Administered Medications  Medication Dose Route Frequency Provider Last Rate Last Dose  . acetaminophen (TYLENOL) tablet 650 mg  650 mg Oral Q6H PRN Nanine Means, NP      . alum & mag hydroxide-simeth (MAALOX/MYLANTA) 200-200-20 MG/5ML suspension 30 mL  30 mL Oral Q4H PRN Nanine Means, NP      . ARIPiprazole (ABILIFY) tablet  15 mg  15 mg Oral Daily Rachael Fee, MD   15 mg at 05/08/12 0813  . atorvastatin (LIPITOR) tablet 20 mg  20 mg Oral q1800 Nanine Means, NP   20 mg at 05/07/12 1724  . buPROPion (WELLBUTRIN XL) 24 hr tablet 300 mg  300 mg Oral Daily Rachael Fee, MD   300 mg at 05/08/12 0813  . clonazePAM (KLONOPIN) tablet 1 mg  1 mg Oral BID Chihiro Frey, MD      . famciclovir [facility]) tablet 250 mg  250 mg Oral QHS Nanine Means, NP   250 mg at 05/07/12 2313  . gabapentin (NEURONTIN) capsule 300 mg  300 mg Oral QHS Rachael Fee, MD   300 mg at 05/07/12 2314  . ibuprofen (ADVIL,MOTRIN) tablet 600 mg  600 mg Oral TID PRN Nanine Means, NP   600 mg at 05/06/12 0403  . lurasidone (LATUDA) tablet 40 mg  40 mg Oral QHS Rachael Fee, MD   40 mg at 05/07/12 2313  . magnesium hydroxide (MILK OF MAGNESIA) suspension 30 mL  30 mL Oral Daily PRN Nanine Means, NP   30 mL at 05/07/12 1438  . pantoprazole (PROTONIX) EC tablet 40 mg  40 mg Oral Daily Nanine Means, NP   40 mg at 05/08/12 0814  . PARoxetine (PAXIL) tablet 30 mg  30 mg Oral QHS Rachael Fee, MD   30 mg at 05/07/12 2314  . polyethylene  glycol (MIRALAX / GLYCOLAX) packet 17 g  17 g Oral QODAY Nanine Means, NP   17 g at 05/08/12 1044  . traZODone (DESYREL) tablet 50 mg  50 mg Oral QHS PRN Nanine Means, NP   50 mg at 05/06/12 2116  . [DISCONTINUED] clonazePAM (KLONOPIN) tablet 1 mg  1 mg Oral TID AC Rachael Fee, MD   1 mg at 05/08/12 1610    Lab Results: No results found for this or any previous visit (from the past 48 hour(s)).  Physical Findings: AIMS: Facial and Oral Movements Muscles of Facial Expression: None, normal Lips and Perioral Area: None, normal Jaw: None, normal Tongue: None, normal,Extremity Movements Upper (arms, wrists, hands, fingers): None, normal Lower (legs, knees, ankles, toes): None, normal, Trunk Movements Neck, shoulders, hips: None, normal, Overall Severity Severity of abnormal movements (highest score from questions above):  None, normal Incapacitation due to abnormal movements: None, normal Patient's awareness of abnormal movements (rate only patient's report): No Awareness, Dental Status Current problems with teeth and/or dentures?: No Does patient usually wear dentures?: No  CIWA:  CIWA-Ar Total: 0  COWS:  COWS Total Score: 1   Treatment Plan Summary: Daily contact with patient to assess and evaluate symptoms and progress in treatment Medication management  Plan: Decrease Clonazepam to1mg  po bid. Discontinue Latuda. Discussed with patient that her treatment strategy will be to reduce the number of medications she is on and add appropriate and needed medications. She understands the plan. Encouraged to attend groups. Medard Decuir 05/08/2012, 10:51 AM

## 2012-05-08 NOTE — Progress Notes (Signed)
D: Pt voices no complaints.  Spent most of shift sleeping, did get up to take HS medications without prompting.  Facial expression blank with brief eye contact- Pt did not seem to fully wake up.  Affect and mood depressed; speech logical and coherent in the context of minimal interaction with staff and peers.  No evidence of disorganized thought process or content.  Denies SI/HI, AVH, and acute pain.  Contracting for safety on unit.  A: Pt encouraged to remain out of bed for some time but chose not to.  No PRNs given.  Medications given according to med orders and POC.  Q15 minute safety checks maintained as per unit protocol.  R: Safety maintained. Dion Saucier RN

## 2012-05-08 NOTE — Progress Notes (Signed)
Psychoeducational Group Note  Date:  05/08/2012 Time:  1100a  Group Topic/Focus:  Recovery Goals:   The focus of this group is to identify appropriate goals for recovery and establish a plan to achieve them.  Participation Level:  Active  Participation Quality:  Appropriate and Attentive  Affect:  Appropriate  Cognitive:  Alert and Appropriate  Insight:  Engaged  Engagement in Group:  Engaged  Additional Comments:  Pt. Participated in group and set goals for her recovery   Meredith Staggers 05/08/2012, 1:19 PM

## 2012-05-09 MED ORDER — BUPROPION HCL 100 MG PO TABS: 400.0000 mg | ORAL_TABLET | Freq: Once | ORAL | Status: DC

## 2012-05-09 MED ORDER — BUPROPION HCL ER (SR) 100 MG PO TB12
200.0000 mg | ORAL_TABLET | Freq: Two times a day (BID) | ORAL | Status: DC
  Administered 2012-05-10 – 2012-05-15 (×11): 200 mg via ORAL
  Filled 2012-05-09 (×16): qty 2

## 2012-05-09 MED ORDER — BUPROPION HCL ER (SR) 100 MG PO TB12
100.0000 mg | ORAL_TABLET | Freq: Once | ORAL | Status: AC
  Administered 2012-05-09: 100 mg via ORAL
  Filled 2012-05-09: qty 1

## 2012-05-09 NOTE — Progress Notes (Signed)
D:  [name] denies SI/HI/AVH at this time.  She appears depressed and complained of some back pain.  She attended evening group and has been interacting appropriately with staff and other patients. A:  Medications given as ordered, ibuprofen given for pain.  Emotional support provided.  Safety checks q 15 minutes. R:  Safety maintained on unit.  Ibuprofen helped with pain.
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
## 2012-05-09 NOTE — Progress Notes (Signed)
[facility] MD Progress Note  05/09/2012 10:51 AM [name]  MRN:  [id number]  S: Patient continues to be depressed and endorsing suicidal thoughts without plan. She is more alert today. Tolerated taper in Xanax and discontinuation of the Latuda. Her ACTT team attended treatment team today. They report patient has family issues that have contributed to her Depression but she is not ready to discuss them.  Diagnosis:   Axis I: Major Depression, Recurrent severe Axis II: No diagnosis Axis III:  Past Medical History  Diagnosis Date  . GERD (gastroesophageal reflux disease)   . Back pain   . Depression   . Schizoaffective disorder   . High cholesterol    Axis IV: other psychosocial or environmental problems Axis V: 41-50 serious symptoms  ADL's:  Intact  Sleep: Fair  Appetite:  Fair  Mental Status Examination/Evaluation: Objective:  Appearance: Casual  Eye Contact::  Fair  Speech:  Clear and Coherent  Volume:  Decreased  Mood:  Depressed and Dysphoric  Affect:  Constricted and Flat  Thought Process:  Coherent  Orientation:  Full  Thought Content:  WDL  Suicidal Thoughts:  Yes.  without intent/plan  Homicidal Thoughts:  No  Memory:  Immediate;   Fair Recent;   Fair Remote;   Fair  Judgement:  Impaired  Insight:  Present  Psychomotor Activity:  Normal and Psychomotor Retardation  Concentration:  Fair  Recall:  Fair  Akathisia:  No  Handed:  Right  AIMS (if indicated):     Assets:  Communication Skills  Sleep:  Number of Hours: 6.5    Vital Signs:Blood pressure 110/77, pulse 94, temperature 97.9 F (36.6 C), temperature source Oral, resp. rate 16, height 5' (1.524 m), weight 69.854 kg (154 lb), last menstrual period 04/23/2012. Current Medications: Current Facility-Administered Medications  Medication Dose Route Frequency Provider Last Rate Last Dose  . acetaminophen (TYLENOL) tablet 650 mg  650 mg Oral Q6H PRN Nanine Means, NP      . alum & mag hydroxide-simeth  (MAALOX/MYLANTA) 200-200-20 MG/5ML suspension 30 mL  30 mL Oral Q4H PRN Nanine Means, NP      . ARIPiprazole (ABILIFY) tablet 15 mg  15 mg Oral Daily Rachael Fee, MD   15 mg at 05/09/12 0741  . atorvastatin (LIPITOR) tablet 20 mg  20 mg Oral q1800 Nanine Means, NP   20 mg at 05/08/12 1807  . buPROPion [facility]) tablet 400 mg  400 mg Oral Once Kristofer Schaffert, MD      . clonazePAM (KLONOPIN) tablet 1 mg  1 mg Oral BID Ruchi Stoney, MD   1 mg at 05/09/12 0741  . famciclovir [facility]) tablet 250 mg  250 mg Oral QHS Nanine Means, NP   250 mg at 05/08/12 2152  . gabapentin (NEURONTIN) capsule 300 mg  300 mg Oral QHS Rachael Fee, MD   300 mg at 05/08/12 2151  . ibuprofen (ADVIL,MOTRIN) tablet 600 mg  600 mg Oral TID PRN Nanine Means, NP   600 mg at 05/06/12 0403  . magnesium hydroxide (MILK OF MAGNESIA) suspension 30 mL  30 mL Oral Daily PRN Nanine Means, NP   30 mL at 05/07/12 1438  . pantoprazole (PROTONIX) EC tablet 40 mg  40 mg Oral Daily Nanine Means, NP   40 mg at 05/09/12 0741  . PARoxetine (PAXIL) tablet 30 mg  30 mg Oral QHS Rachael Fee, MD   30 mg at 05/08/12 2152  . polyethylene glycol (MIRALAX / GLYCOLAX) packet 17 g  17 g Oral Sheppard Coil, NP   17 g at 05/08/12 1044  . traZODone (DESYREL) tablet 50 mg  50 mg Oral QHS PRN Nanine Means, NP   50 mg at 05/06/12 2116  . [DISCONTINUED] buPROPion (WELLBUTRIN XL) 24 hr tablet 300 mg  300 mg Oral Daily Rachael Fee, MD   300 mg at 05/09/12 0741  . [DISCONTINUED] lurasidone (LATUDA) tablet 40 mg  40 mg Oral QHS Rachael Fee, MD   40 mg at 05/07/12 2313    Lab Results: No results found for this or any previous visit (from the past 48 hour(s)).  Physical Findings: AIMS: Facial and Oral Movements Muscles of Facial Expression: None, normal Lips and Perioral Area: None, normal Jaw: None, normal Tongue: None, normal,Extremity Movements Upper (arms, wrists, hands, fingers): None, normal Lower (legs, knees, ankles, toes): None,  normal, Trunk Movements Neck, shoulders, hips: None, normal, Overall Severity Severity of abnormal movements (highest score from questions above): None, normal Incapacitation due to abnormal movements: None, normal Patient's awareness of abnormal movements (rate only patient's report): No Awareness, Dental Status Current problems with teeth and/or dentures?: No Does patient usually wear dentures?: No  CIWA:  CIWA-Ar Total: 0  COWS:  COWS Total Score: 1   Treatment Plan Summary: Daily contact with patient to assess and evaluate symptoms and progress in treatment Medication management  Plan: Increase Wellbutrin to 400mg  daily. Continue to monitor mood symptoms and adjust medications as needed. Encouraged to attend groups.  [name] 05/09/2012, 10:51 AM

## 2012-05-09 NOTE — Progress Notes (Signed)
D: Patient denies SI/HI and auditory and visual hallucinations. The patient has a depressed mood and affect. The patient rates her depression and hopelessness both an 8 out of 10 (1 low/10 high). The patient reports sleeping fairly well and states that her appetite is good but that her energy level is low. The patient is attending groups and is interacting appropriately within the milieu.  A: Patient given emotional support from RN. Patient encouraged to come to staff with concerns and/or questions. Patient's medication routine continued. Patient's orders and plan of care reviewed.  R: Patient remains appropriate and cooperative. Will continue to monitor patient q15 minutes for safety.

## 2012-05-09 NOTE — Progress Notes (Signed)
Patient ID: [name], female   DOB: 08/02/1965, 46 y.o.   MRN: [id number]  PER STATE REGULATIONS 482.30  THIS CHART WAS REVIEWED FOR MEDICAL NECESSITY WITH RESPECT TO THE PATIENT'S ADMISSION/ DURATION OF STAY.  NEXT REVIEW DATE: 05/12/2012  Willa Rough, RN, BSN CASE MANAGER

## 2012-05-09 NOTE — Progress Notes (Signed)
Patient ID: [name], female   DOB: December 31, 1965, 46 y.o.   MRN: [id number] D: Patient in bed sleeping. Respiration regular and unlabored. No sign of distress noted at this time A: 15 mins checks for  safety R: Patient remains asleep.

## 2012-05-09 NOTE — Progress Notes (Signed)
Pt asleep in bed. Eyes closed. Resting comfortably. Breathing regular and nonlabored. Q15 min safety checks maintained. Will continue to monitor.
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
## 2012-05-09 NOTE — Progress Notes (Signed)
[facility] Group Notes:  (Counselor/Nursing/MHT/Case Management/Adjunct)  05/09/2012 3:02 PM  Type of Therapy:  Psychoeducational Skills  Participation Level:  Active  Participation Quality:  Appropriate and Attentive  Affect:  Blunted and Depressed  Cognitive:  Appropriate and Oriented  Insight:  Good  Engagement in Group:  Engaged  Engagement in Therapy:  n/a  Modes of Intervention:  Activity, Discussion, Education, Problem-solving, Socialization and Support  Summary of Progress/Problems: [name] attended psychoeducation group that focused on using quality time with support systems/individuals to engage in healthy coping skills. [name] participated in an activity guessing about self and peers. [name] was quiet but attentive while group discussed who their support systems are, how they can spend positive quality time with them as a coping skills and a way to strengthen their relationship. [name] was given a homework assignment to find two ways to improve her support systems and twenty activities she can do to spend quality time with her supports.    Wandra Scot 05/09/2012, 3:02 PM

## 2012-05-10 MED ORDER — ARIPIPRAZOLE 10 MG PO TABS
10.0000 mg | ORAL_TABLET | Freq: Every day | ORAL | Status: DC
  Administered 2012-05-11 – 2012-05-15 (×5): 10 mg via ORAL
  Filled 2012-05-10 (×8): qty 1

## 2012-05-10 MED ORDER — MAGNESIUM CITRATE PO SOLN
1.0000 | Freq: Once | ORAL | Status: AC
  Administered 2012-05-10: 1 via ORAL

## 2012-05-10 NOTE — Progress Notes (Signed)
Psychoeducational Group Note  Date:  05/10/2012 Time:  2000  Group Topic/Focus:  Karaoke   Participation Level:  Active  Participation Quality:  Appropriate  Affect:  Appropriate  Cognitive:  Appropriate  Insight:  Engaged  Engagement in Group:  None  Additional Comments:    Lety Cullens A 05/10/2012, 10:28 PM

## 2012-05-10 NOTE — Progress Notes (Signed)
D: Patient denies SI/HI and auditory and visual hallucinations. The patient has a depressed mood and affect. The patient rates her depression and hopelessness both a 7 out of 10 (1 low/10 high). The patient reports sleeping fairly well and states that her appetite is good but that her energy level is low. The patient is attending groups and interacting appropriately within the milieu.  A: Patient given emotional support from RN. Patient encouraged to come to staff with concerns and/or questions. Patient's medication routine continued. Patient's orders and plan of care reviewed.  R: Patient remains appropriate and cooperative. Will continue to monitor patient q15 minutes for safety.

## 2012-05-10 NOTE — Progress Notes (Signed)
[facility] MD Progress Note  05/10/2012 10:43 AM [name]  MRN:  [id number]  Subjective:  Patient reports depressed mood, states she would prefer a state of nonexistence, though denies active suicidal thoughts. Feeling more anxious and irritable today. Reports she has been on Wellbutrin 450mg  twice daily in the past, it was somewhat helpful. Waking up several times at night, poor quality of sleep.  Diagnosis:   Axis I: Major Depression, Recurrent severe Axis II: Deferred Axis III:  Past Medical History  Diagnosis Date  . GERD (gastroesophageal reflux disease)   . Back pain   . Depression   . Schizoaffective disorder   . High cholesterol    Axis IV: housing problems and other psychosocial or environmental problems Axis V: 41-50 serious symptoms  ADL's:  Intact  Sleep: Poor  Appetite:  Fair    Psychiatric Specialty Exam: Review of Systems  Constitutional: Negative.   HENT: Negative.   Eyes: Negative.   Respiratory: Negative.   Cardiovascular: Negative.   Gastrointestinal: Negative.   Genitourinary: Negative.   Skin: Negative.   Neurological: Negative.   Endo/Heme/Allergies: Negative.   Psychiatric/Behavioral: Positive for depression and suicidal ideas. The patient is nervous/anxious and has insomnia.     Blood pressure 100/80, pulse 69, temperature 97.9 F (36.6 C), temperature source Oral, resp. rate 15, height 5' (1.524 m), weight 69.854 kg (154 lb), last menstrual period 04/23/2012.Body mass index is 30.08 kg/(m^2).  General Appearance: Casual  Eye Contact::  Good  Speech:  Slow  Volume:  Decreased  Mood:  Anxious and Depressed  Affect:  Depressed and Flat  Thought Process:  Coherent  Orientation:  Full (Time, Place, and Person)  Thought Content:  Negative  Suicidal Thoughts:  Yes.  without intent/plan  Homicidal Thoughts:  No  Memory:  Immediate;   Fair Recent;   Fair Remote;   Fair  Judgement:  Fair  Insight:  Fair  Psychomotor Activity:  Decreased   Concentration:  Fair  Recall:  Fair  Akathisia:  Negative  Handed:  Right  AIMS (if indicated):     Assets:  Communication Skills  Sleep:  Number of Hours: 6.75    Current Medications: Current Facility-Administered Medications  Medication Dose Route Frequency Provider Last Rate Last Dose  . acetaminophen (TYLENOL) tablet 650 mg  650 mg Oral Q6H PRN Nanine Means, NP      . alum & mag hydroxide-simeth (MAALOX/MYLANTA) 200-200-20 MG/5ML suspension 30 mL  30 mL Oral Q4H PRN Nanine Means, NP   30 mL at 05/09/12 1934  . ARIPiprazole (ABILIFY) tablet 10 mg  10 mg Oral Daily Deldrick Linch, MD      . atorvastatin (LIPITOR) tablet 20 mg  20 mg Oral q1800 Nanine Means, NP   20 mg at 05/09/12 1708  . [COMPLETED] buPROPion (WELLBUTRIN SR) 12 hr tablet 100 mg  100 mg Oral Once Charina Fons, MD   100 mg at 05/09/12 1319  . buPROPion [facility]) 12 hr tablet 200 mg  200 mg Oral BID Maahir Horst, MD   200 mg at 05/10/12 0734  . clonazePAM (KLONOPIN) tablet 1 mg  1 mg Oral BID Exie Chrismer, MD   1 mg at 05/10/12 0735  . famciclovir [facility]) tablet 250 mg  250 mg Oral QHS Nanine Means, NP   250 mg at 05/09/12 2130  . gabapentin (NEURONTIN) capsule 300 mg  300 mg Oral QHS Rachael Fee, MD   300 mg at 05/09/12 2130  . ibuprofen (ADVIL,MOTRIN) tablet 600 mg  600 mg Oral TID PRN Nanine Means, NP   600 mg at 05/10/12 0926  . magnesium hydroxide (MILK OF MAGNESIA) suspension 30 mL  30 mL Oral Daily PRN Nanine Means, NP   30 mL at 05/07/12 1438  . pantoprazole (PROTONIX) EC tablet 40 mg  40 mg Oral Daily Nanine Means, NP   40 mg at 05/10/12 0734  . PARoxetine (PAXIL) tablet 30 mg  30 mg Oral QHS Rachael Fee, MD   30 mg at 05/09/12 2130  . polyethylene glycol (MIRALAX / GLYCOLAX) packet 17 g  17 g Oral QODAY Nanine Means, NP   17 g at 05/10/12 0927  . traZODone (DESYREL) tablet 50 mg  50 mg Oral QHS PRN Nanine Means, NP   50 mg at 05/06/12 2116  . [DISCONTINUED] ARIPiprazole (ABILIFY) tablet 15 mg  15  mg Oral Daily Rachael Fee, MD   15 mg at 05/10/12 0734  . [DISCONTINUED] buPROPion (WELLBUTRIN XL) 24 hr tablet 300 mg  300 mg Oral Daily Rachael Fee, MD   300 mg at 05/09/12 0741  . [DISCONTINUED] buPROPion (WELLBUTRIN) tablet 400 mg  400 mg Oral Once Soleia Badolato, MD        Lab Results: No results found for this or any previous visit (from the past 48 hour(s)).  Physical Findings: AIMS: Facial and Oral Movements Muscles of Facial Expression: None, normal Lips and Perioral Area: None, normal Jaw: None, normal Tongue: None, normal,Extremity Movements Upper (arms, wrists, hands, fingers): None, normal Lower (legs, knees, ankles, toes): None, normal, Trunk Movements Neck, shoulders, hips: None, normal, Overall Severity Severity of abnormal movements (highest score from questions above): None, normal Incapacitation due to abnormal movements: None, normal Patient's awareness of abnormal movements (rate only patient's report): No Awareness, Dental Status Current problems with teeth and/or dentures?: No Does patient usually wear dentures?: No  CIWA:  CIWA-Ar Total: 0  COWS:  COWS Total Score: 1   Treatment Plan Summary: Daily contact with patient to assess and evaluate symptoms and progress in treatment Medication management  Plan: Decrease Abilify to 10mg . Consider tapering off the Wellbutrin and starting provigil. counselled patient on being motivated to do better, attend groups and focus on activities that she enjoys.  Medical Decision Making Problem Points:  Established problem, stable/improving (1), Review of last therapy session (1), Review of psycho-social stressors (1) and Self-limited or minor (1) Data Points:  Review of medication regiment & side effects (2) Review of new medications or change in dosage (2)  I certify that inpatient services furnished can reasonably be expected to improve the patient's condition.   Bhavik Cabiness 05/10/2012, 10:43 AM

## 2012-05-10 NOTE — Social Work (Signed)
[facility] LCSW Aftercare Discharge Planning Group Note  05/10/2012  8:45 AM  Participation Quality:  Attentive and Sharing  Affect:  Blunted and Flat  Cognitive:  Appropriate  Insight:  Limited  Engagement in Group:  Engaged  Modes of Intervention:  Clarification, Discussion and Support  Summary of Progress/Problems: Pt attended discharge planning group and actively participated in group.  CSW provided pt with today's workbook.  Patient rates herself a 7 for depression, 6-7 for anxiety, 4 for helplessness, and 5 for hopelessness. Patient reported yesterday in group that she has a rather large support and this worker asked her why she felt so hopeless with some and support available to her. Patient reported "I stay hopeless to my constant depression and not feeling well. It's too early in the morning right now to know whether or not I'm suicidal and I can contract for safety"   [facility] LCSW Group Therapy  05/10/2012 1:15 PM  Type of Therapy:  Group Therapy  Participation Level:  Active  Participation Quality:  Attentive, Sharing and Supportive  Affect:  Blunted and Flat  Cognitive:  Appropriate  Insight:  Engaged  Engagement in Therapy:  Engaged  Modes of Intervention:  Discussion, Education, Exploration and Problem-solving  Summary of Progress/Problems: Patient was insightful and participated well in group discussion focused on including balance in life. Patient reports she becomes out of balance during the holidays do to increased stress. Patient says she finds herself subconsciously shopping for Christmas gifts to give to people as good bye gifts when she is suicidal and doesn't even realize it. Patient reports "I get disgusted at myself because I can't get past the suicidal ideations. It either stops to succeed". Patient reports the main reasons that she has not completed suicide is because of the people in her life will support her by getting her to contract for safety. Patient reports  her son keeps her live but says knowing that creates a great burden for her because it creates a burden for him. Patient was very supportive of other peers.  Patton Salles, LCSW 05/10/2012 6:49 AM

## 2012-05-10 NOTE — Progress Notes (Signed)
Psychoeducational Group Note  Date:  05/10/2012 Time:  1000  Group Topic/Focus:  Making Healthy Choices:   The focus of this group is to help patients identify negative/unhealthy choices they were using prior to admission and identify positive/healthier coping strategies to replace them upon discharge.  Participation Level:  Active  Participation Quality:  Appropriate  Affect:  Appropriate  Cognitive:  Appropriate  Insight:  Engaged  Engagement in Group:  Engaged  Additional Comments:  Patient participated in groups and identified several ways in which she could cope with stress better. The patient also was supportive of others and listened and offered support as needed.  Nestor Ramp MCCOLLUM 05/10/2012, 12:09 PM

## 2012-05-10 NOTE — Progress Notes (Signed)
Psychoeducational Group Note  Date:  05/09/2012 Time:  2015  Group Topic/Focus:  Wrap-Up Group:   The focus of this group is to help patients review their daily goal of treatment and discuss progress on daily workbooks.  Participation Level:  Active  Participation Quality:  Redirectable and Resistant  Affect:  Blunted  Cognitive:  Oriented  Insight:  Limited  Engagement in Group:  Lacking  Additional Comments:  Patient shared that she did go to a few groups today and that she wants to go home.  Mateus Rewerts, Newton Pigg 05/10/2012, 6:28 AM

## 2012-05-10 NOTE — Progress Notes (Signed)
Psychoeducational Group Note  Date:  05/10/2012 Time: 1100  Group Topic/Focus:  Overcoming Stress:   The focus of this group is to define stress and help patients assess their triggers.  Participation Level:  Active  Participation Quality:  Appropriate, Attentive, Sharing and Supportive  Affect:  Appropriate  Cognitive:  Appropriate  Insight:  Engaged  Engagement in Group:  Engaged  Additional Comments: Patient attended overcoming stress group. Patient asked to define stress in own terms. Patient then asked to find a partner within the group to complete a stress interview from daily workbook. Patient completed and spoke about what stresses the patient and feelings, and symptoms of stress. Afterwards patient completed management ideas with ways to handle stress by doing things that are enjoyable to patient when feeling stress.   Karleen Hampshire Brittini 05/10/2012, 7:00 PM

## 2012-05-11 MED ORDER — ARIPIPRAZOLE 5 MG PO TABS
5.0000 mg | ORAL_TABLET | Freq: Once | ORAL | Status: AC
  Administered 2012-05-11: 5 mg via ORAL

## 2012-05-11 MED ORDER — LORAZEPAM 0.5 MG PO TABS
0.5000 mg | ORAL_TABLET | Freq: Once | ORAL | Status: AC
  Administered 2012-05-11: 0.5 mg via ORAL

## 2012-05-11 MED ORDER — LORAZEPAM 0.5 MG PO TABS
ORAL_TABLET | ORAL | Status: AC
  Administered 2012-05-11: 0.5 mg via ORAL
  Filled 2012-05-11: qty 1

## 2012-05-11 NOTE — Progress Notes (Signed)
Patient ID: [name], female   DOB: 07-14-1965, 46 y.o.   MRN: [id number] [facility] MD Progress Note  05/11/2012 1:14 PM [name]  MRN:  [id number]  Subjective: "I bang my head because it makes me cope better, I have to do it several times at home, at least for 30 minutes, that was my means of coping. I don't know why I can't do it here, why can't you all let me bang my head. This works for me. I bang my head to feel something. Now I don't feel anything because I did not do it long enough.  No, I was not trying to hurt my self. I think about suicide a lot, but I have no plans or means".  O: [name] was observed by the staff banging her head on the wall. She also gave one of the staff her writings about things in her life that are confusing to her.    Diagnosis:   Axis I: Major Depression, Recurrent severe Axis II: Deferred Axis III:  Past Medical History  Diagnosis Date  . GERD (gastroesophageal reflux disease)   . Back pain   . Depression   . Schizoaffective disorder   . High cholesterol    Axis IV: housing problems and other psychosocial or environmental problems Axis V: 41-50 serious symptoms  ADL's:  Intact  Sleep: Poor  Appetite:  Fair    Psychiatric Specialty Exam: Review of Systems  Constitutional: Negative.   HENT: Negative.   Eyes: Negative.   Respiratory: Negative.   Cardiovascular: Negative.   Gastrointestinal: Negative.   Genitourinary: Negative.   Skin: Negative.   Neurological: Negative.   Endo/Heme/Allergies: Negative.   Psychiatric/Behavioral: Positive for depression (currently being stabilized with medication.) and suicidal ideas (patient reports is "off and on, but denies plans or intent. However,was observed banging her head on the wall this shift. She states that it was how she copes at home.       ). The patient is nervous/anxious (currently being stabilized with medication.) and has insomnia (Currently on Trazodone for sleep Q bedtime.).      Blood pressure 100/80, pulse 69, temperature 97.9 F (36.6 C), temperature source Oral, resp. rate 15, height 5' (1.524 m), weight 69.854 kg (154 lb), last menstrual period 04/23/2012.Body mass index is 30.08 kg/(m^2).  General Appearance: Casual  Eye Contact::  Good  Speech:  Slow  Volume:  Decreased  Mood:  Anxious and Depressed  Affect:  Depressed and Flat  Thought Process:  Coherent  Orientation:  Full (Time, Place, and Person)  Thought Content:  Negative  Suicidal Thoughts:  Yes.  without intent/plan, off and on.  Homicidal Thoughts:  No  Memory:  Immediate;   Fair Recent;   Fair Remote;   Fair  Judgement:  Fair  Insight:  Fair  Psychomotor Activity:  Decreased  Concentration:  Fair  Recall:  Fair  Akathisia:  Negative  Handed:  Right  AIMS (if indicated):     Assets:  Communication Skills  Sleep:  Number of Hours: 5.75    Current Medications: Current Facility-Administered Medications  Medication Dose Route Frequency Provider Last Rate Last Dose  . acetaminophen (TYLENOL) tablet 650 mg  650 mg Oral Q6H PRN Nanine Means, NP      . alum & mag hydroxide-simeth (MAALOX/MYLANTA) 200-200-20 MG/5ML suspension 30 mL  30 mL Oral Q4H PRN Nanine Means, NP   30 mL at 05/09/12 1934  . ARIPiprazole (ABILIFY) tablet 10 mg  10 mg Oral Daily  Patrick North, MD   10 mg at 05/11/12 0732  . [COMPLETED] ARIPiprazole (ABILIFY) tablet 5 mg  5 mg Oral Once Himabindu Ravi, MD   5 mg at 05/11/12 1152  . atorvastatin (LIPITOR) tablet 20 mg  20 mg Oral q1800 Nanine Means, NP   20 mg at 05/10/12 1714  . buPROPion (WELLBUTRIN SR) 12 hr tablet 200 mg  200 mg Oral BID Himabindu Ravi, MD   200 mg at 05/11/12 0732  . clonazePAM (KLONOPIN) tablet 1 mg  1 mg Oral BID Himabindu Ravi, MD   1 mg at 05/11/12 0732  . famciclovir [facility]) tablet 250 mg  250 mg Oral QHS Nanine Means, NP   250 mg at 05/10/12 2203  . gabapentin (NEURONTIN) capsule 300 mg  300 mg Oral QHS Rachael Fee, MD   300 mg at 05/10/12 2204   . ibuprofen (ADVIL,MOTRIN) tablet 600 mg  600 mg Oral TID PRN Nanine Means, NP   600 mg at 05/10/12 1436  . [COMPLETED] LORazepam (ATIVAN) 0.5 MG tablet        0.5 mg at 05/11/12 1200  . [COMPLETED] LORazepam (ATIVAN) tablet 0.5 mg  0.5 mg Oral Once Himabindu Ravi, MD   0.5 mg at 05/11/12 1145  . [COMPLETED] magnesium citrate solution 1 Bottle  1 Bottle Oral Once Himabindu Ravi, MD   1 Bottle at 05/10/12 1434  . magnesium hydroxide (MILK OF MAGNESIA) suspension 30 mL  30 mL Oral Daily PRN Nanine Means, NP   30 mL at 05/07/12 1438  . pantoprazole (PROTONIX) EC tablet 40 mg  40 mg Oral Daily Nanine Means, NP   40 mg at 05/11/12 0732  . PARoxetine (PAXIL) tablet 30 mg  30 mg Oral QHS Rachael Fee, MD   30 mg at 05/10/12 2203  . polyethylene glycol (MIRALAX / GLYCOLAX) packet 17 g  17 g Oral QODAY Nanine Means, NP   17 g at 05/10/12 0927  . traZODone (DESYREL) tablet 50 mg  50 mg Oral QHS PRN Nanine Means, NP   50 mg at 05/10/12 2203    Lab Results: No results found for this or any previous visit (from the past 48 hour(s)).  Physical Findings: AIMS: Facial and Oral Movements Muscles of Facial Expression: None, normal Lips and Perioral Area: None, normal Jaw: None, normal Tongue: None, normal,Extremity Movements Upper (arms, wrists, hands, fingers): None, normal Lower (legs, knees, ankles, toes): None, normal, Trunk Movements Neck, shoulders, hips: None, normal, Overall Severity Severity of abnormal movements (highest score from questions above): None, normal Incapacitation due to abnormal movements: None, normal Patient's awareness of abnormal movements (rate only patient's report): No Awareness, Dental Status Current problems with teeth and/or dentures?: No Does patient usually wear dentures?: No  CIWA:  CIWA-Ar Total: 0  COWS:  COWS Total Score: 1   Treatment Plan Summary: Daily contact with patient to assess and evaluate symptoms and progress in treatment Medication  management  Plan: Increase Abilify to 15mg . Counseled patient on the effects of self injurious behaviors such as head banging. Reminded and encouraged patient to apply her learned coping skills from group counseling in time of stress, anxiety symptoms. Have Dr. Daleen Bo review patient's writings about her confusing life. Have patient not go to the cafeteria this afternoon for lunch. Have staff monitor patient closely for safety. Ice application to back of head prn. Continue current treatment plan.  Medical Decision Making Problem Points:  Established problem, stable/improving (1), Review of last therapy session (1), Review of  psycho-social stressors (1) and Self-limited or minor (1) Data Points:  Review of medication regiment & side effects (2) Review of new medications or change in dosage (2)  I certify that inpatient services furnished can reasonably be expected to improve the patient's condition.   Armandina Stammer I 05/11/2012, 1:14 PM

## 2012-05-11 NOTE — Progress Notes (Signed)
Pt is observed resting in bed with eyes closed. RR WNL, even and unlabored. Level III obs in place for safety. Pt is safe. Lawrence Marseilles

## 2012-05-11 NOTE — H&P (Signed)
Agree with assessment and plan Vondra Aldredge A. Jasdeep Kepner, M.D. 

## 2012-05-11 NOTE — Social Work (Signed)
Interdisciplinary Treatment Plan Update (Adult)  Date:  05/11/2012  Time Reviewed:  6:53 AM    Progress in Treatment: Attending groups:   Yes   Participating in groups:  Yes Taking medication as prescribed:  Yes Tolerating medication:  Yes Family/Significant othe contact made: With patient's assisted living worker Patient understands diagnosis:  Yes Discussing patient identified problems/goals with staff: Yes Medical problems stabilized or resolved: Yes Denies suicidal/homicidal ideation: No -  Patient able to contract for safety Issues/concerns per patient self-inventory:  Other:  New problem(s) identified: Patient has been writing bizarre passages about her family and her birth  Reason for Continuation of Hospitalization: Anxiety Depression Medication stabilization Suicidal ideation Possible psychosis Interventions implemented related to continuation of hospitalization:  Medication mgement; safety checks q 15 mins; coping skills development  Additional comments: Patient presented the clinical social worker with bizarre writings regarding her family and her birth which will be shared with M.D. patient reports she is feeling worse and having suicidal thoughts but will contract for safety. Patient was reporting she is not having a good day today and appeared to isolate herself in group which is atypical for patient.  Estimated length of stay: 3-5 days  Discharge Plan:  Outpatient follow up scheduled  New goal(s):  Review of initial/current patient goals per problem list:    1.  Goal(s): Eliminate SI/other thoughts of self harm   Met:  No  Target date: d/c  As evidenced by: Patient will no longer endorse SI/HI or other thoughts of self harm.    2.  Goal (s):Reduce depression/anxiety  Met: No  Target date: d/c  As evidenced by: Patient will rate symptoms at four or below    3.  Goal(s):.stabilize on meds   Met:  No  Target date: d/c  As evidenced by:  Patient will report being stabilized on medications - less symptomatic    4.  Goal(s): Refer for outpatient follow up   Met:  No  Target date: d/c  As evidenced by: Follow up appointment will be scheduled    Attendees: Patient:   @TD  6:53 AM  Physican:  Patrick North, MD @TD  6:53 AM  Nursing:  Berneice Heinrich, RN  05/11/2012 6:53 AM  Nursing:    @TD  6:53 AM  Clinical Social Worker:  Patton Salles, LCSW @TD  6:53 AM  Other: Joanell Rising 05/11/2012 6:53 AM   Other:         05/11/2012 6:53 AM Other:

## 2012-05-11 NOTE — Progress Notes (Addendum)
D: Patient having passive SI but denies any HI or auditory and visual hallucinations. The patient has a depressed mood and affect. The patient rates her depression and hopelessness both a 7 out of 10 (1 low/10 high). The patient reports sleeping poorly and states that her appetite is good but that her energy level is low. The patient began hitting her head in her room around 1130, stating that it "made her feel better" and that it was her "coping skill." The patients vitals were taken and a mini-neuro assessment was done by RN (patient alert and oriented x3; vitals stable). The patient was seen by the NP. The patient states that she "can't help doing it" and that she will try to "do it again." A member of the patient's ACT Team came to the unit to offer support and education for the patient.  A: Patient given emotional support from RN. Patient encouraged to come to staff with concerns and/or questions. Patient's medication routine continued. Patient's orders and plan of care reviewed. Patient seated within range of medication window to monitor patient's activity. Per MD orders, patient was given 0.5mg  of ativan and 5mg  of abilify. Patient was given ice pack and education regarding better coping skills.  R: Patient remains cooperative. Will continue to monitor patient q15 minutes for safety.

## 2012-05-11 NOTE — Social Work (Signed)
[facility] LCSW Aftercare Discharge Planning Group Note  05/11/2012  8:45 AM  Participation Quality:  Attentive  Affect:  Blunted, Depressed and Flat  Cognitive:  Appropriate  Insight:  Improving  Engagement in Group:  Limited  Modes of Intervention:  Discussion, Education and Support  Summary of Progress/Problems: Pt attended discharge planning group and actively participated in group.  CSW provided pt with today's workbook.  Suicide education and prevention was process in today's group. Patient reports "I'm not as good today as I have been and I need some individual counseling". Patient reports banging her head at the assisted living facility prior to coming in the hospital and says one of the coping skills she learned to address such was pouring cold water on her head and complained that the water is not cold enough here to address her needs. Patient ranks herself a 7 across the board for depression, anxiety, helplessness and hopelessness and reports having suicidal ideations but was able to contract for safety.   [facility] LCSW Group Therapy  05/11/2012 1:15 PM  Type of Therapy:  Group Therapy  Participation Level:  Minimal  Participation Quality:  Attentive  Affect:  Blunted, Depressed and Flat  Cognitive:  Confused  Insight:  Limited  Engagement in Therapy:  Limited  Modes of Intervention:  Discussion, Education and Support  Summary of Progress/Problems: Patient participated minimally in group focused on relapse prevention. Patient stated "I'm afraid information but I give out will be used against me to keep me here". Patient later admitted to while she is uncomfortable staying here she is also somewhat fearful of leaving. Patient was much more withdrawn during today's group than in previous groups.   Patton Salles, LCSW 05/11/2012 6:53 AM

## 2012-05-12 DIAGNOSIS — F419 Anxiety disorder, unspecified: Secondary | ICD-10-CM | POA: Diagnosis present

## 2012-05-12 MED ORDER — HYDROXYZINE HCL 25 MG PO TABS
25.0000 mg | ORAL_TABLET | Freq: Three times a day (TID) | ORAL | Status: DC | PRN
  Administered 2012-05-12 – 2012-05-13 (×3): 25 mg via ORAL

## 2012-05-12 NOTE — Clinical Social Work Note (Signed)
[facility] Group Notes: (Clinical Social Work)   05/12/2012  3-4pm   Type of Therapy:  Group Therapy   Participation Level:  Did Not Attend    Ambrose Mantle, LCSW 05/12/2012, 4:33 PM

## 2012-05-12 NOTE — Progress Notes (Signed)
[facility] MD Progress Note  05/12/2012 2:17 PM [name]  MRN:  [id number] Subjective:  Anxious, agitation, 10/10 depression Diagnosis:   Axis I: Anxiety Disorder NOS and Major Depression, Recurrent severe Axis II: Deferred Axis III:  Past Medical History  Diagnosis Date  . GERD (gastroesophageal reflux disease)   . Back pain   . Depression   . Schizoaffective disorder   . High cholesterol    Axis IV: other psychosocial or environmental problems, problems related to social environment and problems with primary support group Axis V: 41-50 serious symptoms  ADL's:  Intact  Sleep: Fair  Appetite:  Fair  Suicidal Ideation:  Denies Homicidal Ideation:  Denies  Psychiatric Specialty Exam: ROS:   Denies physical pain  Blood pressure 109/65, pulse 92, temperature 97.9 F (36.6 C), temperature source Oral, resp. rate 16, height 5' (1.524 m), weight 69.854 kg (154 lb), last menstrual period 04/23/2012, SpO2 100.00%.Body mass index is 30.08 kg/(m^2).  General Appearance: Casual  Eye Contact::  Minimal  Speech:  Normal Rate  Volume:  Normal  Mood:  Anxious, Depressed and Irritable  Affect:  Labile  Thought Process:  Logical  Orientation:  Full (Time, Place, and Person)  Thought Content:  NA  Suicidal Thoughts:  Passive suicidal thoughts  Homicidal Thoughts:  No  Memory:  Immediate;   Fair Recent;   Fair Remote;   Fair  Judgement:  Impaired  Insight:  Fair  Psychomotor Activity:  Increased  Concentration:  Fair  Recall:  Fair  Akathisia:  No  Handed:  Right  AIMS (if indicated):     Assets:  Communication Skills Desire for Improvement Housing Social Support  Sleep:  Number of Hours: 4    Current Medications: Current Facility-Administered Medications  Medication Dose Route Frequency Provider Last Rate Last Dose  . acetaminophen (TYLENOL) tablet 650 mg  650 mg Oral Q6H PRN Nanine Means, NP      . alum & mag hydroxide-simeth (MAALOX/MYLANTA) 200-200-20 MG/5ML suspension  30 mL  30 mL Oral Q4H PRN Nanine Means, NP   30 mL at 05/09/12 1934  . ARIPiprazole (ABILIFY) tablet 10 mg  10 mg Oral Daily Himabindu Ravi, MD   10 mg at 05/12/12 0807  . atorvastatin (LIPITOR) tablet 20 mg  20 mg Oral q1800 Nanine Means, NP   20 mg at 05/11/12 1600  . buPROPion [facility] SR) 12 hr tablet 200 mg  200 mg Oral BID Himabindu Ravi, MD   200 mg at 05/12/12 0807  . clonazePAM (KLONOPIN) tablet 1 mg  1 mg Oral BID Himabindu Ravi, MD   1 mg at 05/12/12 0807  . famciclovir [facility]) tablet 250 mg  250 mg Oral QHS Nanine Means, NP   250 mg at 05/11/12 2112  . gabapentin (NEURONTIN) capsule 300 mg  300 mg Oral QHS Rachael Fee, MD   300 mg at 05/11/12 2112  . hydrOXYzine (ATARAX/VISTARIL) tablet 25 mg  25 mg Oral TID PRN Nanine Means, NP      . ibuprofen (ADVIL,MOTRIN) tablet 600 mg  600 mg Oral TID PRN Nanine Means, NP   600 mg at 05/11/12 1500  . magnesium hydroxide (MILK OF MAGNESIA) suspension 30 mL  30 mL Oral Daily PRN Nanine Means, NP   30 mL at 05/07/12 1438  . pantoprazole (PROTONIX) EC tablet 40 mg  40 mg Oral Daily Nanine Means, NP   40 mg at 05/12/12 0807  . PARoxetine (PAXIL) tablet 30 mg  30 mg Oral QHS Reymundo Poll  Dub Mikes, MD   30 mg at 05/11/12 2112  . polyethylene glycol (MIRALAX / GLYCOLAX) packet 17 g  17 g Oral QODAY Nanine Means, NP   17 g at 05/12/12 1007  . traZODone (DESYREL) tablet 50 mg  50 mg Oral QHS PRN Nanine Means, NP   50 mg at 05/11/12 2113    Lab Results: No results found for this or any previous visit (from the past 48 hour(s)).  Physical Findings: AIMS: Facial and Oral Movements Muscles of Facial Expression: None, normal Lips and Perioral Area: None, normal Jaw: None, normal Tongue: None, normal,Extremity Movements Upper (arms, wrists, hands, fingers): None, normal Lower (legs, knees, ankles, toes): None, normal, Trunk Movements Neck, shoulders, hips: None, normal, Overall Severity Severity of abnormal movements (highest score from questions above):  None, normal Incapacitation due to abnormal movements: None, normal Patient's awareness of abnormal movements (rate only patient's report): No Awareness, Dental Status Current problems with teeth and/or dentures?: No Does patient usually wear dentures?: No  CIWA:  CIWA-Ar Total: 0  COWS:  COWS Total Score: 1   Treatment Plan Summary: Daily contact with patient to assess and evaluate symptoms and progress in treatment Medication management  Plan:  Patient reported with anxiety/agitation and wanting to bang her head, ordered PRN vistaril 25 mg--given, having her pace during assessment until her medication works, states her depression 10/10, denies physical pain, mother is supportive, worried since her mother stated she would not always be around to help--stated she didn't realize that until she said it, reinforced her support system in the area, monitoring continues.  Medical Decision Making Problem Points:  Established problem, worsening (2) Data Points:  Review of medication regiment & side effects (2) Review of new medications or change in dosage (2)  I certify that inpatient services furnished can reasonably be expected to improve the patient's condition.   Nanine Means, PMH-NP 05/12/2012, 2:17 PM

## 2012-05-12 NOTE — Progress Notes (Signed)
Psychoeducational Group Note  Date:  05/11/2012 Time:  2000  Group Topic/Focus:  Wrap-Up Group:   The focus of this group is to help patients review their daily goal of treatment and discuss progress on daily workbooks.  Participation Level:  Active  Participation Quality:  Appropriate  Affect:  Appropriate  Cognitive:  Appropriate  Insight:  Engaged  Engagement in Group:  Engaged  Additional Comments:  Pt attended wrap-up group this evening and participated. Pt didn't share much with group but was very supportive of peers.    Terrilee Dudzik A 05/12/2012, 3:25 AM

## 2012-05-12 NOTE — Progress Notes (Signed)
Patient attended group this evening and then came to medication window and requested her hs medications before taking her bath. Writer inquired as to how her day had been and about her incident earlier today with banging her head. Patient reports that is how she has always coped and is now working on different means to cope that are more positive. Patient received her hs medications, support and encouragement offered, safety maintained on unit with 15 min checks, will continue to monitor. Patient reports passive si and verbally contracts for safety , -hi/a/v hallucinations

## 2012-05-12 NOTE — Progress Notes (Signed)
D) Pt has been attending the groups and interacting with select peers. Rates her depression at a 7 and her hopelessness at a 5. States that she is having SI on and off throughout the day. Contracted for her safety and approached the med window asking for clippers to cut her nails. While in the process of cutting her nails, Pt stated "you know why I asked for these clippers, don't you?" Pt. Then proceeded to tell this writer that she has been scratching her upper thighs. Pt's upper thighs observed and there is no redness, scratches or breaks in the skin. Pt then began hitting her head with her right hand in a fist, stating "this won't hurt me, it just makes me feel better". Pt. In conversation and while talking has a smile on her face. Her actions and her verbiage are incongruent. Asked for something to help "with my nerves". A) Pt provided with a 1:1. Active listening provided. Attempts made at processing with the Pt., however Pt is argumentative, not looking for solutions, rather looking for permission to continue her behavior. Given a rubber band to help her with having pain in a more constructive way, and so as not to take away Pt's coping skills until new ones can be looked at and explored. Given support, reassurance and praise for coming to staff. R) Pt was given 25  Mg of Vistaril by the NP. And taken for her daily visit with the NP. Pt was able to contract for her safety stating that she would come to this Clinical research associate.

## 2012-05-13 DIAGNOSIS — F339 Major depressive disorder, recurrent, unspecified: Secondary | ICD-10-CM

## 2012-05-13 DIAGNOSIS — F411 Generalized anxiety disorder: Secondary | ICD-10-CM

## 2012-05-13 MED ORDER — HYDROXYZINE HCL 25 MG PO TABS
25.0000 mg | ORAL_TABLET | ORAL | Status: DC | PRN
  Administered 2012-05-13 – 2012-05-15 (×5): 25 mg via ORAL
  Filled 2012-05-13: qty 1

## 2012-05-13 NOTE — Progress Notes (Signed)
Patient attended group this evening and came to medication window to request her hs scheduled medications. Writer spoke with patient earlier in her room and she continues to endorse passive si and verbally contracts. Writer inquired as to what her goal was for discharge and she reports that she plans to return to her care home and is working on working on her feelings of anxiety and positive ways to cope. Writer offered patient support and encouragement, patient requested a prn dose of visteril which she received. Writer will monitor effectiveness of medication. Safety maintained on unit, will continue to monitor.

## 2012-05-13 NOTE — Clinical Social Work Note (Signed)
[facility] Group Notes:  (Clinical Social Work)  05/13/2012   3:00-4:00PM  Summary of Progress/Problems:   Summary of Progress/Problems:   The main focus of today's process group was for the patient to define "support" and describe what healthy supports are, then to identify the patient's current support system and decide on other supports that can be put in place to prevent future hospitalizations.  Roleplay was used to demonstrate definitions of different types of available supports.  An emphasis was placed on using therapist, doctor, therapy groups, self-help groups and problem-specific support groups to expand supports. The patient expressed much humor during this group, and was the volunteer used by CSW to demonstrate the various meanings of "support."  She received much encouragement and praise for her participation and assistance in helping the group to understand.  Type of Therapy:  Process Group  Participation Level:  Active  Participation Quality:  Appropriate, Attentive, Sharing and Supportive  Affect:  Blunted  Cognitive:  Alert, Appropriate and Oriented  Insight:  Engaged  Engagement in Therapy:  Engaged  Modes of Intervention:  Clarification, Education, Limit-setting, Problem-solving, Socialization, Support and Processing, Exploration, Discussion   Ambrose Mantle, LCSW 05/13/2012, 6:09 PM

## 2012-05-13 NOTE — Progress Notes (Signed)
[facility] MD Progress Note  05/13/2012 2:07 PM [name]  MRN:  [id number] Subjective:  [name] continues to express a significant amount of anxiety and depression. She endorses a desire to bang her head against the wall, and activity that brings her relief. She reports states that she is unable to bang her head against the wall, and she scratches herself. She endorses suicidal thoughts to fall from an elevation, but denies any intention of carrying that out at this time. She denies any homicidal ideation. She denies any auditory or visual hallucinations. Her appetite and sleep are good. She feels that the Vistaril prescribed yesterday may be helpful, but she's not been able to get as often as she feels she needs. She denies any side effects to it.  Diagnosis:   Axis I: Anxiety Disorder NOS and Major Depression, Recurrent severe Axis II: Personality Disorder NOS Axis III:  Past Medical History  Diagnosis Date  . GERD (gastroesophageal reflux disease)   . Back pain   . Depression   . Schizoaffective disorder   . High cholesterol     ADL's:  Intact  Sleep: Good  Appetite:  Good  Suicidal Ideation:  Patient endorses thoughts and plan, but denies intent Homicidal Ideation:  Patient denies any thought, plan, or intent AEB (as evidenced by):  Psychiatric Specialty Exam: Review of Systems  Constitutional: Negative.   HENT: Negative.   Respiratory: Negative.   Cardiovascular: Negative.   Musculoskeletal: Negative.   Skin: Negative.   Neurological: Negative.   Endo/Heme/Allergies: Negative.   Psychiatric/Behavioral: Positive for depression and suicidal ideas. Negative for hallucinations and substance abuse. The patient is nervous/anxious.     Blood pressure 115/74, pulse 89, temperature 97.7 F (36.5 C), temperature source Oral, resp. rate 18, height 5' (1.524 m), weight 69.854 kg (154 lb), last menstrual period 04/23/2012, SpO2 100.00%.Body mass index is 30.08 kg/(m^2).  General  Appearance: Casual and Well Groomed  Eye Contact::  Good  Speech:  Clear and Coherent  Volume:  Normal  Mood:  Anxious  Affect:  Congruent  Thought Process:  Logical  Orientation:  Full (Time, Place, and Person)  Thought Content:  Obsessions  Suicidal Thoughts:  Yes.  without intent/plan  Homicidal Thoughts:  No  Memory:  Immediate;   Good Recent;   Good Remote;   Good  Judgement:  Fair  Insight:  Fair  Psychomotor Activity:  Restlessness  Concentration:  Good  Recall:  Good  Akathisia:  No  Handed:  Right  AIMS (if indicated):     Assets:  Communication Skills Desire for Improvement  Sleep:  Number of Hours: 6.25    Current Medications: Current Facility-Administered Medications  Medication Dose Route Frequency Provider Last Rate Last Dose  . acetaminophen (TYLENOL) tablet 650 mg  650 mg Oral Q6H PRN Nanine Means, NP      . alum & mag hydroxide-simeth (MAALOX/MYLANTA) 200-200-20 MG/5ML suspension 30 mL  30 mL Oral Q4H PRN Nanine Means, NP   30 mL at 05/09/12 1934  . ARIPiprazole (ABILIFY) tablet 10 mg  10 mg Oral Daily Himabindu Ravi, MD   10 mg at 05/13/12 6962  . atorvastatin (LIPITOR) tablet 20 mg  20 mg Oral q1800 Nanine Means, NP   20 mg at 05/12/12 1726  . buPROPion [facility] SR) 12 hr tablet 200 mg  200 mg Oral BID Himabindu Ravi, MD   200 mg at 05/13/12 0807  . clonazePAM (KLONOPIN) tablet 1 mg  1 mg Oral BID Patrick North, MD  1 mg at 05/13/12 5409  . famciclovir [facility]) tablet 250 mg  250 mg Oral QHS Nanine Means, NP   250 mg at 05/12/12 2109  . gabapentin (NEURONTIN) capsule 300 mg  300 mg Oral QHS Rachael Fee, MD   300 mg at 05/12/12 2109  . hydrOXYzine (ATARAX/VISTARIL) tablet 25 mg  25 mg Oral Q4H PRN Jorje Guild, PA-C      . ibuprofen (ADVIL,MOTRIN) tablet 600 mg  600 mg Oral TID PRN Nanine Means, NP   600 mg at 05/11/12 1500  . magnesium hydroxide (MILK OF MAGNESIA) suspension 30 mL  30 mL Oral Daily PRN Nanine Means, NP   30 mL at 05/07/12 1438  .  pantoprazole (PROTONIX) EC tablet 40 mg  40 mg Oral Daily Nanine Means, NP   40 mg at 05/13/12 0807  . PARoxetine (PAXIL) tablet 30 mg  30 mg Oral QHS Rachael Fee, MD   30 mg at 05/12/12 2109  . polyethylene glycol (MIRALAX / GLYCOLAX) packet 17 g  17 g Oral QODAY Nanine Means, NP   17 g at 05/12/12 1007  . traZODone (DESYREL) tablet 50 mg  50 mg Oral QHS PRN Nanine Means, NP   50 mg at 05/12/12 2109  . [DISCONTINUED] hydrOXYzine (ATARAX/VISTARIL) tablet 25 mg  25 mg Oral TID PRN Nanine Means, NP   25 mg at 05/13/12 8119    Lab Results: No results found for this or any previous visit (from the past 48 hour(s)).  Physical Findings: AIMS: Facial and Oral Movements Muscles of Facial Expression: None, normal Lips and Perioral Area: None, normal Jaw: None, normal Tongue: None, normal,Extremity Movements Upper (arms, wrists, hands, fingers): None, normal Lower (legs, knees, ankles, toes): None, normal, Trunk Movements Neck, shoulders, hips: None, normal, Overall Severity Severity of abnormal movements (highest score from questions above): None, normal Incapacitation due to abnormal movements: None, normal Patient's awareness of abnormal movements (rate only patient's report): No Awareness, Dental Status Current problems with teeth and/or dentures?: No Does patient usually wear dentures?: No  CIWA:  CIWA-Ar Total: 0  COWS:  COWS Total Score: 1   Treatment Plan Summary: Daily contact with patient to assess and evaluate symptoms and progress in treatment Medication management  Plan: We will increase the frequency of her Vistaril to every 4 hours as needed. We will try to find a stress ball that she can manipulate 4 relief of anxiety.  Medical Decision Making Problem Points:  Established problem, stable/improving (1), Review of last therapy session (1) and Review of psycho-social stressors (1) Data Points:  Review and summation of old records (2) Review of medication regiment & side  effects (2) Review of new medications or change in dosage (2)  I certify that inpatient services furnished can reasonably be expected to improve the patient's condition.   Lalena Salas 05/13/2012, 2:07 PM

## 2012-05-13 NOTE — Progress Notes (Signed)
D) Pt rates her depression at an 8 and her hopelessness at a 5. Admits to Thoughts of SI on and off. Approached this Clinical research associate today and stated that she wanted to bang her head against the wall.  A) Provided Pt with a lengthy 1:1 where feelings of hurting herself and seeking attention were explored. Pt provided her history of being in many psychiatric hospitals thoughout her life and is aware of DBT. States, "none of it works". Gently confronted Pt about her behavior and that she did have control over her actions. She did have a choice to come and talk out her thoughts and feelings.  R) Pt was able to contract for her safety and work closely with this Clinical research associate.

## 2012-05-14 NOTE — Progress Notes (Signed)
[facility] MD Progress Note  05/14/2012 11:22 AM [name]  MRN:  [id number] Subjective:  Patient seen today. Mood improving. Complaining of neck pain, has not banged her head over the weekend.  Diagnosis:   Axis I: Major Depression, Recurrent severe Axis II: No diagnosis Axis III:  Past Medical History  Diagnosis Date  . GERD (gastroesophageal reflux disease)   . Back pain   . Depression   . Schizoaffective disorder   . High cholesterol    Axis IV: other psychosocial or environmental problems Axis V: 51-60 moderate symptoms  ADL's:  Intact  Sleep: Fair  Appetite:  Fair    Psychiatric Specialty Exam: Review of Systems  Constitutional: Negative.   HENT: Positive for neck pain.   Eyes: Negative.   Respiratory: Negative.   Cardiovascular: Negative.   Gastrointestinal: Negative.   Genitourinary: Negative.   Skin: Negative.   Neurological: Negative.   Endo/Heme/Allergies: Negative.   Psychiatric/Behavioral: Positive for depression.    Blood pressure 118/80, pulse 94, temperature 98 F (36.7 C), temperature source Oral, resp. rate 16, height 5' (1.524 m), weight 69.854 kg (154 lb), last menstrual period 04/23/2012, SpO2 100.00%.Body mass index is 30.08 kg/(m^2).  General Appearance: Casual  Eye Contact::  Fair  Speech:  Slow  Volume:  Decreased  Mood:  Depressed  Affect:  Constricted and Labile  Thought Process:  Coherent  Orientation:  Full (Time, Place, and Person)  Thought Content:  WDL  Suicidal Thoughts:  Yes.  without intent/plan  Homicidal Thoughts:  No  Memory:  Immediate;   Fair Recent;   Fair Remote;   Fair  Judgement:  Fair  Insight:  Fair  Psychomotor Activity:  Normal  Concentration:  Fair  Recall:  Fair  Akathisia:  No  Handed:  Right  AIMS (if indicated):     Assets:  Communication Skills  Sleep:  Number of Hours: 6.5    Current Medications: Current Facility-Administered Medications  Medication Dose Route Frequency Provider Last Rate Last Dose   . acetaminophen (TYLENOL) tablet 650 mg  650 mg Oral Q6H PRN Nanine Means, NP      . alum & mag hydroxide-simeth (MAALOX/MYLANTA) 200-200-20 MG/5ML suspension 30 mL  30 mL Oral Q4H PRN Nanine Means, NP   30 mL at 05/09/12 1934  . ARIPiprazole (ABILIFY) tablet 10 mg  10 mg Oral Daily Blondine Hottel, MD   10 mg at 05/14/12 0758  . atorvastatin (LIPITOR) tablet 20 mg  20 mg Oral q1800 Nanine Means, NP   20 mg at 05/13/12 1853  . buPROPion (WELLBUTRIN SR) 12 hr tablet 200 mg  200 mg Oral BID Maisy Newport, MD   200 mg at 05/14/12 0830  . clonazePAM (KLONOPIN) tablet 1 mg  1 mg Oral BID Verdon Ferrante, MD   1 mg at 05/14/12 0759  . famciclovir [facility]) tablet 250 mg  250 mg Oral QHS Nanine Means, NP   250 mg at 05/13/12 2217  . gabapentin (NEURONTIN) capsule 300 mg  300 mg Oral QHS Rachael Fee, MD   300 mg at 05/13/12 2204  . hydrOXYzine (ATARAX/VISTARIL) tablet 25 mg  25 mg Oral Q4H PRN Jorje Guild, PA-C   25 mg at 05/14/12 1119  . ibuprofen (ADVIL,MOTRIN) tablet 600 mg  600 mg Oral TID PRN Nanine Means, NP   600 mg at 05/11/12 1500  . magnesium hydroxide (MILK OF MAGNESIA) suspension 30 mL  30 mL Oral Daily PRN Nanine Means, NP   30 mL at 05/07/12 1438  .  pantoprazole (PROTONIX) EC tablet 40 mg  40 mg Oral Daily Nanine Means, NP   40 mg at 05/14/12 0759  . PARoxetine (PAXIL) tablet 30 mg  30 mg Oral QHS Rachael Fee, MD   30 mg at 05/13/12 2204  . polyethylene glycol (MIRALAX / GLYCOLAX) packet 17 g  17 g Oral QODAY Nanine Means, NP   17 g at 05/12/12 1007  . traZODone (DESYREL) tablet 50 mg  50 mg Oral QHS PRN Nanine Means, NP   50 mg at 05/13/12 2204  . [DISCONTINUED] hydrOXYzine (ATARAX/VISTARIL) tablet 25 mg  25 mg Oral TID PRN Nanine Means, NP   25 mg at 05/13/12 1914    Lab Results: No results found for this or any previous visit (from the past 48 hour(s)).  Physical Findings: AIMS: Facial and Oral Movements Muscles of Facial Expression: None, normal Lips and Perioral Area: None,  normal Jaw: None, normal Tongue: None, normal,Extremity Movements Upper (arms, wrists, hands, fingers): None, normal Lower (legs, knees, ankles, toes): None, normal, Trunk Movements Neck, shoulders, hips: None, normal, Overall Severity Severity of abnormal movements (highest score from questions above): None, normal Incapacitation due to abnormal movements: None, normal Patient's awareness of abnormal movements (rate only patient's report): No Awareness, Dental Status Current problems with teeth and/or dentures?: No Does patient usually wear dentures?: No  CIWA:  CIWA-Ar Total: 0  COWS:  COWS Total Score: 1   Treatment Plan Summary: Daily contact with patient to assess and evaluate symptoms and progress in treatment Medication management  Plan: Continue current plan of care. Plan for discharge tomorrow.  Medical Decision Making Problem Points:  Established problem, stable/improving (1), Review of last therapy session (1) and Review of psycho-social stressors (1) Data Points:  Review of medication regiment & side effects (2)  I certify that inpatient services furnished can reasonably be expected to improve the patient's condition.   [name] 05/14/2012, 11:22 AM

## 2012-05-14 NOTE — Social Work (Signed)
[facility] LCSW Aftercare Discharge Planning Group Note  05/14/2012  8:45 AM  Participation Quality:  Attentive and Sharing  Affect:  Blunted, Depressed and Flat  Cognitive:  Appropriate  Insight:  Engaged  Engagement in Group:  Engaged  Modes of Intervention:  Discussion, Education and Support  Summary of Progress/Problems: Pt attended discharge planning group and actively participated in group.  CSW provided pt with today's workbook.  Suicide education and prevention was discussed in today's group. Patient reports having a good weekend after getting a visit from her brother. Patient says she's been able to open up and talk with others on the unit which also makes her feel good. Patient reports having on and off suicidal ideations and rates herself a 6 for depression and anxiety and a 5 for helplessness and hopelessness. Patient reports the Vistaril she has been started on has been helpful.   [facility] LCSW Group Therapy  05/14/2012 1:15 PM  Type of Therapy:  Group Therapy  Participation Level:  Minimal  Participation Quality:  Attentive and Sharing  Affect:  Anxious, Blunted and Flat  Cognitive:  Appropriate  Insight:  Engaged  Engagement in Therapy:  Limited  Modes of Intervention:  Clarification, Discussion and Support  Summary of Progress/Problems: Patient did not verbally participate much but was attentive throughout group focused on overcoming obstacles. Patient reports her biggest obstacle is anxiety and says she'll plan to overcome this obstacle by exercising and use of her medications.   Patton Salles LCSW 05/14/2012 7:01 AM

## 2012-05-14 NOTE — Progress Notes (Signed)
Patient attended group this evening and participated. Writer spoke with patient 1:1 and praised patient for looking brihter today.patient reported that she felt a little better today and reported that she didn't know how tomorrow would be. Writer encouraged patient to take one day at a time or hour by hour, minute by minute whatever worked best for her. Patient reported that she hadn't banged her head today and was proud of herself for not resorting to doing that and use more positive ways to cope with her anxiety. Patient reported that she sometimes will either write or draw with the racing thoughts she experiences at times. Patient continues to have passive si she reported and verbally contracts with Clinical research associate. Safety maintaned on unit with 15 min checks, she denies hi/a/v hallucinations, will continue to monitor.

## 2012-05-14 NOTE — Progress Notes (Signed)
Patient ID: [name], female   DOB: Jul 18, 1965, 46 y.o.   MRN: [id number]  PER STATE REGULATIONS 482.30  THIS CHART WAS REVIEWED FOR MEDICAL NECESSITY WITH RESPECT TO THE PATIENT'S ADMISSION/ DURATION OF STAY.  12/8-12/03/2012  NEXT REVIEW DATE: 05/16/2012  Willa Rough, RN, BSN CASE MANAGER

## 2012-05-14 NOTE — Social Work (Signed)
Interdisciplinary Treatment Plan Update (Adult)  Date:  05/14/2012  Time Reviewed:  7:01 AM    Progress in Treatment: Attending groups:   Yes   Participating in groups:  Yes Taking medication as prescribed:  Yes Tolerating medication:  Yes Family/Significant othe contact made: Contact made with assisted living facility Patient understands diagnosis:  Yes Discussing patient identified problems/goals with staff: Yes Medical problems stabilized or resolved: Yes Denies suicidal/homicidal ideation: No -  Patient able to contract for safety Issues/concerns per patient self-inventory:  Other:  New problem(s) identified:  Reason for Continuation of Hospitalization: Anxiety Depression Medication stabilization Suicidal ideation  Interventions implemented related to continuation of hospitalization:  Medication mgement; safety checks q 15 mins; coping skills development  Additional comments: Patient reports feeling better than at her baseline. Patient says she believes she will be ready for discharge when M.D. is ready to discharge her. Patient reports having a good visit from her brother this weekend and feels supported.  Estimated length of stay: 1-2 days  Discharge Plan:  Outpatient follow up scheduled  New goal(s):  Review of initial/current patient goals per problem list:    1.  Goal(s): Eliminate SI/other thoughts of self harm   Met:  No  Target date: d/c  As evidenced by: Patient will no longer endorse SI/HI or other thoughts of self harm.    2.  Goal (s):Reduce depression/anxiety  Met: No  Target date: d/c  As evidenced by: Patient will rate symptoms at four or below    3.  Goal(s):.stabilize on meds   Met:  No  Target date: d/c  As evidenced by: Patient will report being stabilized on medications - less symptomatic    4.  Goal(s): Refer for outpatient follow up   Met:  Yes  Target date: d/c  As evidenced by: Follow up appointment will be  scheduled    Attendees: Patient:   @TD  7:01 AM  Physican:  Patrick North, MD @TD  7:01 AM  Nursing:  Neill Loft, RN  05/14/2012 7:01 AM  Nursing:   Roswell Miners, RN  @TD  7:01 AM  Clinical Social Worker:  Patton Salles, LCSW @TD  7:01 AM  Other: Joanell Rising 05/14/2012 7:01 AM   Other:         05/14/2012 7:01 AM Other:

## 2012-05-14 NOTE — Progress Notes (Signed)
Psychoeducational Group Note  Date:  05/14/2012 Time:  1100a  Group Topic/Focus:  Wellness Toolbox:   The focus of this group is to discuss various aspects of wellness, balancing those aspects and exploring ways to increase the ability to experience wellness.  Patients will create a wellness toolbox for use upon discharge.  Participation Level:  Active  Participation Quality:  Appropriate and Attentive  Affect:  Appropriate  Cognitive:  Alert and Appropriate  Insight:  Engaged  Engagement in Group:  Engaged  Additional Comments:  Pt. Listened attentively in group   Meredith Staggers 05/14/2012, 3:33 PM

## 2012-05-14 NOTE — Progress Notes (Addendum)
In hallway on approach. Appears flat and depressed. Calm and cooperative with assessment. Open and spontaneous in conversation. No acute distress noted. States she has had a good day. Pointed to groups as a good part of today. Also pointed to possible DC in AM as a positive. States she feels like she is ready and her mood has improved since admission. Currently denies SI/HI/AVH and contracts for safety. Requested prn for sleep and anxiety. Only other request was to be woken in AM so she gets an early start.  A: Safety has been maintained with Q15 minute observation. Support and encouragement provided. POC and medications for shift reviewed and understanding verbalized. Meds given as ordered by MD. PRNs provided for insomnia and anxiety  R: Pt is visible and interacting well with peers. She feels like her mood has improved since admission and she feels ready for DC. She offers no questions or concerns. Will f/u response to prn and continue current POC

## 2012-05-14 NOTE — Progress Notes (Signed)
Psychoeducational Group Note  Date:  05/14/2012 Time: 2000  Group Topic/Focus:  Wrap-Up Group:   The focus of this group is to help patients review their daily goal of treatment and discuss progress on daily workbooks.  Participation Level:  Active  Participation Quality:  Appropriate  Affect:  Appropriate  Cognitive:  Appropriate  Insight:  Engaged  Engagement in Group:  Engaged  Additional Comments:  Pt. attended and participated in group  [name] 05/14/2012, 11:36 PM

## 2012-05-14 NOTE — Progress Notes (Signed)
D:  Patient's self inventory sheet, patient sleeps well, has good appetite, normal energy level, good attention span.  Rated depression #6, hopelessness #5.  Denied withdrawals.  SI off/on, contracts for safety.  Has experienced neck pain, stiff neck, joints recently.  Pain goal #3 today.   Worst pain #5.  After discharge, plans to talk to someone about her feelings.  No questions for staff.  Does have discharge plans.  No problems taking medications after discharge. A:  Medications administered per MD order.  Support and encouragement given throughout day.  Support and safety checks completed as ordered. R:  Following treatment plan.  Denied HI.  SI off/on, contracts for safety.   Denied A/V hallucinations.  Patient remains safe and receptive on unit. Patient has attended and participated in groups today.

## 2012-05-15 MED ORDER — ARIPIPRAZOLE 10 MG PO TABS: 10.0000 mg | ORAL_TABLET | Freq: Every day | ORAL | Status: DC

## 2012-05-15 MED ORDER — OMEPRAZOLE 20 MG PO CPDR
40.0000 mg | DELAYED_RELEASE_CAPSULE | Freq: Every day | ORAL | Status: DC
  Filled 2012-05-15: qty 6

## 2012-05-15 MED ORDER — PAROXETINE HCL 30 MG PO TABS: 30.0000 mg | ORAL_TABLET | Freq: Every day | ORAL | Status: DC

## 2012-05-15 MED ORDER — CLONAZEPAM 1 MG PO TABS: 1.0000 mg | ORAL_TABLET | Freq: Two times a day (BID) | ORAL | Status: DC

## 2012-05-15 MED ORDER — PAROXETINE HCL 10 MG PO TABS
30.0000 mg | ORAL_TABLET | Freq: Every day | ORAL | Status: DC
  Filled 2012-05-15: qty 9

## 2012-05-15 MED ORDER — CLONAZEPAM 1 MG PO TABS
1.0000 mg | ORAL_TABLET | Freq: Two times a day (BID) | ORAL | Status: DC
  Filled 2012-05-15: qty 6

## 2012-05-15 MED ORDER — POLYETHYLENE GLYCOL 3350 17 G PO PACK
17.0000 g | PACK | ORAL | Status: DC
  Filled 2012-05-15: qty 2

## 2012-05-15 MED ORDER — PANTOPRAZOLE SODIUM 40 MG PO TBEC
40.0000 mg | DELAYED_RELEASE_TABLET | Freq: Every day | ORAL | Status: DC
  Filled 2012-05-15: qty 3

## 2012-05-15 MED ORDER — ATORVASTATIN CALCIUM 20 MG PO TABS: 20.0000 mg | ORAL_TABLET | Freq: Every day | ORAL | Status: DC

## 2012-05-15 MED ORDER — ATORVASTATIN CALCIUM 10 MG PO TABS
20.0000 mg | ORAL_TABLET | Freq: Every day | ORAL | Status: DC
  Filled 2012-05-15: qty 6

## 2012-05-15 MED ORDER — BUPROPION HCL ER (SR) 100 MG PO TB12
200.0000 mg | ORAL_TABLET | Freq: Two times a day (BID) | ORAL | Status: DC
  Filled 2012-05-15: qty 12

## 2012-05-15 MED ORDER — FAMCICLOVIR 250 MG PO TABS: 250.0000 mg | ORAL_TABLET | Freq: Every day | ORAL | Status: DC

## 2012-05-15 MED ORDER — FAMCICLOVIR 500 MG PO TABS
250.0000 mg | ORAL_TABLET | Freq: Every day | ORAL | Status: DC
  Filled 2012-05-15: qty 2

## 2012-05-15 MED ORDER — GABAPENTIN 300 MG PO CAPS: 300.0000 mg | ORAL_CAPSULE | Freq: Every day | ORAL | Status: DC

## 2012-05-15 MED ORDER — GABAPENTIN 300 MG PO CAPS
300.0000 mg | ORAL_CAPSULE | Freq: Every day | ORAL | Status: DC
  Filled 2012-05-15: qty 3

## 2012-05-15 MED ORDER — ARIPIPRAZOLE 10 MG PO TABS
10.0000 mg | ORAL_TABLET | Freq: Every day | ORAL | Status: DC
  Filled 2012-05-15: qty 3

## 2012-05-15 MED ORDER — TRAZODONE HCL 50 MG PO TABS
50.0000 mg | ORAL_TABLET | Freq: Every evening | ORAL | Status: DC | PRN
  Filled 2012-05-15: qty 6

## 2012-05-15 MED ORDER — OMEPRAZOLE 40 MG PO CPDR: 40.0000 mg | DELAYED_RELEASE_CAPSULE | Freq: Every day | ORAL | Status: DC

## 2012-05-15 MED ORDER — BUPROPION HCL ER (SR) 200 MG PO TB12: 200.0000 mg | ORAL_TABLET | Freq: Two times a day (BID) | ORAL | Status: DC

## 2012-05-15 MED ORDER — TRAZODONE HCL 50 MG PO TABS: 50.0000 mg | ORAL_TABLET | Freq: Every evening | ORAL | Status: DC | PRN

## 2012-05-15 NOTE — Progress Notes (Signed)
Discharge Note:  Patient was picked up and taken to group home.  All important information about care, medications, and discharge information was given to transporter.  Patient denied SI and HI.   Denied A/V hallucinations.  Denied pain.  Patient stated she appreciated all assistance received from staff while at [facility].  Patient has been cooperative and pleasant.

## 2012-05-15 NOTE — Progress Notes (Signed)
Grief Group  The group focused on the various ways in which grief around loss is experienced and the impact of certain losses on identity. There was discussion of what happens when grief is not embraced but bottled up or avoided. Group members were actively engaged with each other in supporting each other. Pt was attentive and talked about how she can experience strong grief emotions which seem to come unexpectedly. She is bothered by how others perceive he when this happens.   [name]

## 2012-05-15 NOTE — Discharge Summary (Signed)
Physician Discharge Summary Note  Patient:  [name] is an 46 y.o., female MRN:  [id number] DOB:  May 14, 1966 Patient phone:  [phone] (home)  Patient address:   [address] [place] [address],   Date of Admission:  05/06/2012 Date of Discharge: 05/15/2012  Reason for Admission:  Depression  Discharge Diagnoses: Principal Problem:  *Major depression, recurrent Active Problems:  Anxiety Discharge Diagnoses:  AXIS I: Major Depression, Recurrent severe  AXIS II: No diagnosis  AXIS III:  Past Medical History   Diagnosis  Date   .  GERD (gastroesophageal reflux disease)    .  Back pain    .  Depression    .  Schizoaffective disorder    .  High cholesterol     AXIS IV: occupational problems and other psychosocial or environmental problems  AXIS V: 61-70 mild symptoms Review of Systems  Constitutional: Negative.  Negative for fever, chills, weight loss, malaise/fatigue and diaphoresis.  HENT: Negative for congestion and sore throat.   Eyes: Negative for blurred vision, double vision and photophobia.  Respiratory: Negative for cough, shortness of breath and wheezing.   Cardiovascular: Negative for chest pain, palpitations and PND.  Gastrointestinal: Negative for heartburn, nausea, vomiting, abdominal pain, diarrhea and constipation.  Musculoskeletal: Negative for myalgias, joint pain and falls.  Neurological: Negative for dizziness, tingling, tremors, sensory change, speech change, focal weakness, seizures, loss of consciousness, weakness and headaches.  Endo/Heme/Allergies: Negative for polydipsia. Does not bruise/bleed easily.  Psychiatric/Behavioral: Negative for depression, suicidal ideas, hallucinations, memory loss and substance abuse. The patient is not nervous/anxious and does not have insomnia.    Level of Care:  OP  Hospital Course:  [name] was admitted as a walk-in patient due to her reports of increased depression with suicidal ideation and a plan to cut her  carotid artery at 2AM with a razor blade.  She was admitted for crisis management and stabilization. [name] reported her symptoms to be increasing anhedonia, depression, insomnia, loss of energy, fatigue, hopelessness, helplessness, excessive worry and crying spells.      She was evaluated by the Treatment team including MD, NP, RN, CM and LCSW. A discharge plan was developed as was a plan of care.  Her symptoms were treated with Abilify, Wellbutrin SR, Neurontin, and Paxil. She was given Trazodone for sleep.  [name] was encouraged to participate in group and unit programming to process her feelings in a safe environment.      [name] was seen by a clinical provider daily to assess her response to treatment. Her verbal reports as well as her written reports of symptoms completed on a daily self inventory helped to assess her mood and emotional status. As her medication was titrated to the current dose her symptoms began to resolve. By the 9th day [name] felt stable enough to return home. She was evaluated by the treatment team who felt that she was indeed stable enough to return home. [name] denied SI/HI or auditory hallucinations. She was in full contact with reality and in much improved condition than upon admission.     She was discharged home with the follow up plan as documented below.  Consults:  None  Significant Diagnostic Studies:  None  Discharge Vitals:   Blood pressure 110/70, pulse 93, temperature 97.7 F (36.5 C), temperature source Oral, resp. rate 20, height 5' (1.524 m), weight 69.854 kg (154 lb), last menstrual period 04/23/2012, SpO2 100.00%. Body mass index is 30.08 kg/(m^2). Lab Results:   No results found  for this or any previous visit (from the past 72 hour(s)).  Physical Findings: AIMS: Facial and Oral Movements Muscles of Facial Expression: None, normal Lips and Perioral Area: None, normal Jaw: None, normal Tongue: None, normal,Extremity Movements Upper (arms, wrists, hands,  fingers): None, normal Lower (legs, knees, ankles, toes): None, normal, Trunk Movements Neck, shoulders, hips: None, normal, Overall Severity Severity of abnormal movements (highest score from questions above): None, normal Incapacitation due to abnormal movements: None, normal Patient's awareness of abnormal movements (rate only patient's report): No Awareness, Dental Status Current problems with teeth and/or dentures?: No Does patient usually wear dentures?: No  CIWA:  CIWA-Ar Total: 0  COWS:  COWS Total Score: 1   Psychiatric Specialty Exam: See Psychiatric Specialty Exam and Suicide Risk Assessment completed by Attending Physician prior to discharge.  Discharge destination:  Home  Is patient on multiple antipsychotic therapies at discharge:  No   Has Patient had three or more failed trials of antipsychotic monotherapy by history:  No  Recommended Plan for Multiple Antipsychotic Therapies: Not applicable  Discharge Orders    Future Orders Please Complete By Expires   Diet - low sodium heart healthy      Increase activity slowly      Discharge instructions      Comments:   Take all of your medications as prescribed.  Be sure to keep ALL follow up appointments as scheduled. This is to ensure getting your refills on time to avoid any interruption in your medication.  If you find that you can not keep your appointment, call and reschedule. Be sure to tell the nurse if you will need a refill before your appointment.       Medication List     As of 05/15/2012 11:25 AM    STOP taking these medications         buPROPion 150 MG 12 hr tablet   Commonly known as: ZYBAN      ibuprofen 600 MG tablet   Commonly known as: ADVIL,MOTRIN      magnesium hydroxide 400 MG/5ML suspension   Commonly known as: MILK OF MAGNESIA      polyethylene glycol packet   Commonly known as: MIRALAX / GLYCOLAX      TAKE these medications      Indication    ARIPiprazole 10 MG tablet   Commonly  known as: ABILIFY   Take 1 tablet (10 mg total) by mouth daily. For mental clarity.    Indication: depression      atorvastatin 20 MG tablet   Commonly known as: LIPITOR   Take 1 tablet (20 mg total) by mouth at bedtime. For hyperlipidemia.    Indication: Type II B Hyperlipidemia      buPROPion 200 MG 12 hr tablet   Commonly known as: WELLBUTRIN SR   Take 1 tablet (200 mg total) by mouth 2 (two) times daily. For depression at 7 AM and 1PM.    Indication: Major Depressive Disorder      clonazePAM 1 MG tablet   Commonly known as: KLONOPIN   Take 1 tablet (1 mg total) by mouth 2 (two) times daily. For anxiety.    Indication: Panic Disorder      famciclovir 250 MG tablet   Commonly known as: FAMVIR   Take 1 tablet (250 mg total) by mouth at bedtime. For herpes.    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 For anxiety and neuropathic pain.    Indication: Agitation, Trouble Sleeping, Nerve Pain, Pain      omeprazole 40 MG capsule   Commonly known as: PRILOSEC   Take 1 capsule (40 mg total) by mouth daily. For reflux.    Indication: Gastroesophageal Reflux Disease with Current Symptoms      PARoxetine 30 MG tablet   Commonly known as: PAXIL   Take 1 tablet (30 mg total) by mouth at bedtime. For anxiety and depression.    Indication: Generalized Anxiety Disorder, Major Depressive Disorder, depression      traZODone 50 MG tablet   Commonly known as: DESYREL   Take 1 tablet (50 mg total) by mouth at bedtime as needed for sleep (May repeat x 1 if needed).    Indication: Trouble Sleeping           Follow-up Information    [facility] with [facility]. ([facility] will schedule patient's followup for therapy and med management)    Contact information:   [address] [place]  [address] [phone]          Follow-up recommendations:  As noted above  Comments:   Total Discharge Time: >30 minutes  Signed: Lloyd Huger T. Olegario Emberson PAC 05/15/2012, 11:25 AM

## 2012-05-15 NOTE — Progress Notes (Signed)
[facility] Adult Case Management Discharge Plan :  Will you be returning to the same living situation after discharge: Yes,    At discharge, do you have transportation home?:Yes,    Do you have the ability to pay for your medications:Yes,     Release of information consent forms completed and in the chart;  Patient's signature needed at discharge.  Patient to Follow up at: Follow-up Information    Follow [facility] with [facility]. ([facility] will schedule patient's followup for therapy and med management)    Contact information:   [address] [place] [address] [phone]          Patient denies SI/HI:   Yes,       Safety Planning and Suicide Prevention discussed:  Yes,     Patton Salles 05/15/2012, 4:21 PM

## 2012-05-15 NOTE — Progress Notes (Signed)
D:  Patient's self inventory sheet, patient sleeps well, has good appetite, low energy level, good attention span.  Rated depression #6, hopelessness #5.   Denied withdrawals.  SI off/on, contracts for safety.  Has experienced back pain.  Pain goal #4 today, worst pain #6. After discharge, will "contract for safety".  No questions for staff.   Does have discharge plans.  No problems taking medications after discharge. A:  Medications administered per MD orders.  Support and encouragement given throughout day.  Support and safety checks competed as ordered. R:  Following treatment plan.  Denies HI.  SI off/on, contracts for safety.  Patient remains safe and receptive on unit. Patient looking forward to discharge today.

## 2012-05-15 NOTE — Social Work (Signed)
[facility] LCSW Aftercare Discharge Planning Group Note  05/15/2012  8:45 AM  Participation Quality:  Attentive  Affect:  Blunted and Flat  Cognitive:  Oriented  Insight:  Improving  Engagement in Group:  Engaged  Modes of Intervention:  Discussion, Exploration and Support  Summary of Progress/Problems: Pt attended discharge planning group and actively participated in group.  CSW provided pt with today's workbook.  Patient reports she is ready to go today and says she is at her baseline of 5 or 6 for depression. Patient says she has on and off suicidal ideations but can contract for safety and says this is her baseline as well.   [facility] LCSW Group Therapy  05/15/2012 1:15 PM  Type of Therapy:  Group Therapy  Participation Level:  Minimal  Participation Quality:  Attentive and Sharing  Affect:  Appropriate and Blunted  Cognitive:  Appropriate  Insight:  Improving  Engagement in Therapy:  Engaged  Modes of Intervention:  Discussion, Education and Support  Summary of Progress/Problems: Patient participated well in group session focused on recovery and feelings about diagnosis. Patient reported that she has been in and out of treatment facilities since age 46 where she was originally diagnosed as manic depressive when patient believes the medication she was given for her depression actually made her manic. Patient stated she was then given a diagnosis of schizoaffective disorder and feels angry having that diagnosis because of societies views about serious mental illness. Patient did mention that she may have been needed to be given the diagnoses of schizoaffective disorder to get the help that she needs.   Patton Salles LCSW 05/15/2012 6:52 AM

## 2012-05-15 NOTE — [facility] Suicide Risk Assessment (Signed)
Suicide Risk Assessment  Discharge Assessment     Demographic Factors:  Caucasian, Low socioeconomic status and Unemployed Female  Mental Status Per Nursing Assessment::   On Admission:  Suicidal ideation indicated by patient;Suicide plan;Self-harm thoughts;Belief that plan would result in death  Current Mental Status by Physician: Patient alert and oriented to 4. Denies AH/VH/HI. Has chronic suicidal thoughts without a plan, no intention of acting on them. Loss Factors: Loss of significant relationship  Historical Factors: Family history of mental illness or substance abuse and Impulsivity  Risk Reduction Factors:   Sense of responsibility to family and Positive social support  Continued Clinical Symptoms:  Depression:   Recent sense of peace/wellbeing  Cognitive Features That Contribute To Risk:  Closed-mindedness    Suicide Risk:  Minimal: No identifiable suicidal ideation.  Patients presenting with no risk factors but with morbid ruminations; may be classified as minimal risk based on the severity of the depressive symptoms  Discharge Diagnoses:   AXIS I:  Major Depression, Recurrent severe AXIS II:  No diagnosis AXIS III:   Past Medical History  Diagnosis Date  . GERD (gastroesophageal reflux disease)   . Back pain   . Depression   . Schizoaffective disorder   . High cholesterol    AXIS IV:  occupational problems and other psychosocial or environmental problems AXIS V:  61-70 mild symptoms  Plan Of Care/Follow-up recommendations:  Activity:  normal Diet:  normal Follow up with ACT team and other outpatient appointments.  Is patient on multiple antipsychotic therapies at discharge:  No   Has Patient had three or more failed trials of antipsychotic monotherapy by history:  No  Recommended Plan for Multiple Antipsychotic Therapies: NA  [name] 05/15/2012, 10:02 AM

## 2012-05-16 NOTE — Discharge Summary (Signed)
Reviewed

## 2012-05-17 NOTE — Progress Notes (Signed)
Patient Discharge Instructions:  After Visit Summary (AVS):   Faxed to:  05/17/12 Psychiatric Admission Assessment Note:   Faxed to:  05/17/12 Suicide Risk Assessment - Discharge Assessment:   Faxed to:  05/17/12 Faxed/Sent to the Next Level Care provider:  05/17/12 Faxed to [facility] @ [phone]  Jerelene Redden, 05/17/2012, 4:12 PM

## 2019-02-23 ENCOUNTER — Other Ambulatory Visit: Payer: Self-pay

## 2019-02-23 ENCOUNTER — Encounter (HOSPITAL_COMMUNITY): Payer: Self-pay

## 2019-02-23 ENCOUNTER — Emergency Department (HOSPITAL_COMMUNITY)
Admission: EM | Admit: 2019-02-23 | Discharge: 2019-02-24 | Disposition: A | Discharge status: Rehabilitation Facility | Payer: Medicare Other | Attending: Emergency Medicine | Admitting: Emergency Medicine

## 2019-02-23 DIAGNOSIS — F329 Major depressive disorder, single episode, unspecified: Secondary | ICD-10-CM | POA: Insufficient documentation

## 2019-02-23 DIAGNOSIS — F259 Schizoaffective disorder, unspecified: Secondary | ICD-10-CM

## 2019-02-23 DIAGNOSIS — Z79899 Other long term (current) drug therapy: Secondary | ICD-10-CM | POA: Insufficient documentation

## 2019-02-23 LAB — CBC WITH DIFFERENTIAL/PLATELET
Abs Immature Granulocytes: 0.02 10*3/uL (ref 0.00–0.07)
Basophils Absolute: 0 10*3/uL (ref 0.0–0.1)
Basophils Relative: 1 %
Eosinophils Absolute: 0.1 10*3/uL (ref 0.0–0.5)
Eosinophils Relative: 2 %
HCT: 41.5 % (ref 36.0–46.0)
Hemoglobin: 13.6 g/dL (ref 12.0–15.0)
Immature Granulocytes: 0 %
Lymphocytes Relative: 34 %
Lymphs Abs: 2 10*3/uL (ref 0.7–4.0)
MCH: 29.4 pg (ref 26.0–34.0)
MCHC: 32.8 g/dL (ref 30.0–36.0)
MCV: 89.8 fL (ref 80.0–100.0)
Monocytes Absolute: 0.3 10*3/uL (ref 0.1–1.0)
Monocytes Relative: 6 %
Neutro Abs: 3.4 10*3/uL (ref 1.7–7.7)
Neutrophils Relative %: 57 %
Platelets: 244 10*3/uL (ref 150–400)
RBC: 4.62 MIL/uL (ref 3.87–5.11)
RDW: 12 % (ref 11.5–15.5)
WBC: 5.9 10*3/uL (ref 4.0–10.5)
nRBC: 0 % (ref 0.0–0.2)

## 2019-02-23 LAB — COMPREHENSIVE METABOLIC PANEL
ALT: 38 U/L (ref 0–44)
AST: 25 U/L (ref 15–41)
Albumin: 4.4 g/dL (ref 3.5–5.0)
Alkaline Phosphatase: 102 U/L (ref 38–126)
Anion gap: 12 (ref 5–15)
BUN: 13 mg/dL (ref 6–20)
CO2: 25 mmol/L (ref 22–32)
Calcium: 9.1 mg/dL (ref 8.9–10.3)
Chloride: 103 mmol/L (ref 98–111)
Creatinine, Ser: 1.01 mg/dL — ABNORMAL HIGH (ref 0.44–1.00)
GFR calc Af Amer: >60 mL/min (ref >60)
GFR calc non Af Amer: >60 mL/min (ref >60)
Glucose, Bld: 97 mg/dL (ref 70–99)
Potassium: 3.9 mmol/L (ref 3.5–5.1)
Sodium: 140 mmol/L (ref 135–145)
Total Bilirubin: 0.3 mg/dL (ref 0.3–1.2)
Total Protein: 7.3 g/dL (ref 6.5–8.1)

## 2019-02-23 LAB — ETHANOL: Alcohol, Ethyl (B): <10 mg/dL (ref <10)

## 2019-02-23 MED ORDER — CLONAZEPAM 1 MG PO TABS
1.0000 mg | ORAL_TABLET | Freq: Every day | ORAL | Status: DC
  Administered 2019-02-24: 01:00:00 1 mg via ORAL
  Filled 2019-02-23: qty 1

## 2019-02-23 MED ORDER — BUPROPION HCL ER (XL) 150 MG PO TB24: 150.0000 mg | ORAL_TABLET | Freq: Every day | ORAL | Status: DC

## 2019-02-23 MED ORDER — QUETIAPINE FUMARATE 50 MG PO TABS
50.0000 mg | ORAL_TABLET | Freq: Every day | ORAL | Status: DC
  Administered 2019-02-24: 01:00:00 50 mg via ORAL
  Filled 2019-02-23: qty 1

## 2019-02-23 MED ORDER — AMLODIPINE BESYLATE 5 MG PO TABS: 5.0000 mg | ORAL_TABLET | Freq: Every day | ORAL | Status: DC

## 2019-02-23 MED ORDER — ATORVASTATIN CALCIUM 20 MG PO TABS
20.0000 mg | ORAL_TABLET | Freq: Every day | ORAL | Status: DC
  Administered 2019-02-24: 01:00:00 20 mg via ORAL
  Filled 2019-02-23: qty 1

## 2019-02-23 NOTE — ED Notes (Signed)
Pt. Made aware for the need of urine specimen. 

## 2019-02-23 NOTE — ED Provider Notes (Signed)
[place] DEPT Provider Note   CSN: [id number] Arrival date & time: 02/23/19  1939     History   Chief Complaint Chief Complaint  Patient presents with  . Psychiatric Evaluation    HPI [name] is a 53 y.o. female.     The history is provided by the patient and the police. The history is limited by the condition of the patient (psychiatric disorder).    Pt was seen at 2010. Per Police and pt report: Pt brought to ED by Police from [facility] for mental health evaluation. Pt apparently went to her room, got scissors and cut her hair. Pt does admit she was "upset" at the time. Police state pt has been denying SI/HI to them. Pt continues to deny SI/HI.   Past Medical History:  Diagnosis Date  . Back pain   . Depression   . GERD (gastroesophageal reflux disease)   . High cholesterol   . Schizoaffective disorder [facility])     Patient Active Problem List   Diagnosis Date Noted  . Anxiety 05/12/2012  . Major depression, recurrent ([place]) 05/06/2012    History reviewed. No pertinent surgical history.   OB History   No obstetric history on file.      Home Medications    Prior to Admission medications   Medication Sig Start Date End Date Taking? Authorizing Provider  ARIPiprazole (ABILIFY) 10 MG tablet Take 1 tablet (10 mg total) by mouth daily. For mental clarity. 05/15/12   Nena Polio T, PA-C  atorvastatin (LIPITOR) 20 MG tablet Take 1 tablet (20 mg total) by mouth at bedtime. For hyperlipidemia. 05/15/12   Ruben Im, PA-C  buPROPion (WELLBUTRIN SR) 200 MG 12 hr tablet Take 1 tablet (200 mg total) by mouth 2 (two) times daily. For depression at 7 AM and 1PM. 05/15/12   Mashburn, Marlane Hatcher, PA-C  clonazePAM (KLONOPIN) 1 MG tablet Take 1 tablet (1 mg total) by mouth 2 (two) times daily. For anxiety. 05/15/12   Ruben Im, PA-C  famciclovir (FAMVIR) 250 MG tablet Take 1 tablet (250 mg total) by mouth at bedtime. For herpes.  05/15/12   Nena Polio T, PA-C  gabapentin (NEURONTIN) 300 MG capsule Take 1 capsule (300 mg total) by mouth at bedtime. For anxiety and neuropathic pain. 05/15/12   Ruben Im, PA-C  omeprazole (PRILOSEC) 40 MG capsule Take 1 capsule (40 mg total) by mouth daily. For reflux. 05/15/12   Ruben Im, PA-C  PARoxetine (PAXIL) 30 MG tablet Take 1 tablet (30 mg total) by mouth at bedtime. For anxiety and depression. 05/15/12   Ruben Im, PA-C  traZODone (DESYREL) 50 MG tablet Take 1 tablet (50 mg total) by mouth at bedtime as needed for sleep (May repeat x 1 if needed). 05/15/12   Ruben Im PA-C    Family History History reviewed. No pertinent family history.  Social History Social History   Tobacco Use  . Smoking status: Never Smoker  Substance Use Topics  . Alcohol use: Yes    Alcohol/week: 2.0 standard drinks    Types: 2 drink(s) per week  . Drug use: No     Allergies   Sulfa antibiotics   Review of Systems Review of Systems  Unable to perform ROS: Psychiatric disorder     Physical Exam Updated Vital Signs BP (!) 146/100 (BP Location: Left Arm)   Pulse 71   Temp 98.4 F (36.9 C) (Oral)   Resp 18  Ht 5' (1.524 m)   Wt 66.4 kg   SpO2 100%   BMI 28.59 kg/m   Physical Exam 2015: Physical examination:  Nursing notes reviewed; Vital signs and O2 SAT reviewed;  Constitutional: Well developed, Well nourished, Well hydrated, In no acute distress; Head:  Normocephalic, atraumatic; Eyes: EOMI, PERRL, No scleral icterus; ENMT: Mouth and pharynx normal, Mucous membranes moist; Neck: Supple, Full range of motion; Cardiovascular: Regular rate and rhythm; Respiratory: Breath sounds clear, No wheezes.  Speaking full sentences with ease, Normal respiratory effort/excursion; Chest: No deformity, Movement normal; Abdomen: Nondistended; Extremities: No deformity.; Neuro: AA&Ox3, Major CN grossly intact.  Speech clear. No gross focal motor deficits in  extremities. Climbs on and off stretcher easily by herself. Gait steady.; Skin: Color normal, Warm, Dry.; Psych:  Affect flat.    ED Treatments / Results  Labs (all labs ordered are listed, but only abnormal results are displayed)   EKG None  Radiology   Procedures Procedures (including critical care time)  Medications Ordered in ED Medications - No data to display   Initial Impression / Assessment and Plan / ED Course  I have reviewed the triage vital signs and the nursing notes.  Pertinent labs & imaging results that were available during my care of the patient were reviewed by me and considered in my medical decision making (see chart for details).     MDM Reviewed: nursing note, previous chart and vitals Reviewed previous: labs Interpretation: labs   Results for orders placed or performed during the hospital encounter of 02/23/19  Comprehensive metabolic panel  Result Value Ref Range   Sodium 140 135 - 145 mmol/L   Potassium 3.9 3.5 - 5.1 mmol/L   Chloride 103 98 - 111 mmol/L   CO2 25 22 - 32 mmol/L   Glucose, Bld 97 70 - 99 mg/dL   BUN 13 6 - 20 mg/dL   Creatinine, Ser 1.01 (H) 0.44 - 1.00 mg/dL   Calcium 9.1 8.9 - 10.3 mg/dL   Total Protein 7.3 6.5 - 8.1 g/dL   Albumin 4.4 3.5 - 5.0 g/dL   AST 25 15 - 41 U/L   ALT 38 0 - 44 U/L   Alkaline Phosphatase 102 38 - 126 U/L   Total Bilirubin 0.3 0.3 - 1.2 mg/dL   GFR calc non Af Amer >60 >60 mL/min   GFR calc Af Amer >60 >60 mL/min   Anion gap 12 5 - 15  Ethanol  Result Value Ref Range   Alcohol, Ethyl (B) <10 <10 mg/dL  CBC with Differential  Result Value Ref Range   WBC 5.9 4.0 - 10.5 K/uL   RBC 4.62 3.87 - 5.11 MIL/uL   Hemoglobin 13.6 12.0 - 15.0 g/dL   HCT 41.5 36.0 - 46.0 %   MCV 89.8 80.0 - 100.0 fL   MCH 29.4 26.0 - 34.0 pg   MCHC 32.8 30.0 - 36.0 g/dL   RDW 12.0 11.5 - 15.5 %   Platelets 244 150 - 400 K/uL   nRBC 0.0 0.0 - 0.2 %   Neutrophils Relative % 57 %   Neutro Abs 3.4 1.7 - 7.7 K/uL    Lymphocytes Relative 34 %   Lymphs Abs 2.0 0.7 - 4.0 K/uL   Monocytes Relative 6 %   Monocytes Absolute 0.3 0.1 - 1.0 K/uL   Eosinophils Relative 2 %   Eosinophils Absolute 0.1 0.0 - 0.5 K/uL   Basophils Relative 1 %   Basophils Absolute 0.0 0.0 - 0.1  K/uL   Immature Granulocytes 0 %   Abs Immature Granulocytes 0.02 0.00 - 0.07 K/uL    Neasha Conneely was evaluated in Emergency Department on 02/23/2019 for the symptoms described in the history of present illness. She was evaluated in the context of the global COVID-19 pandemic, which necessitated consideration that the patient might be at risk for infection with the SARS-CoV-2 virus that causes COVID-19. Institutional protocols and algorithms that pertain to the evaluation of patients at risk for COVID-19 are in a state of rapid change based on information released by regulatory bodies including the CDC and federal and state organizations. These policies and algorithms were followed during the patient's care in the ED.  2315:  Urine pending. Will have TTS evaluate.    Final Clinical Impressions(s) / ED Diagnoses   Final diagnoses:  None    ED Discharge Orders    None       Francine Graven, DO 02/23/19 [id number]

## 2019-02-23 NOTE — ED Triage Notes (Signed)
Pt arrives from [facility]. She states that they talked her in to being evaluated. Pt states that she was asking for a hemoglobin test because the food changed. Then she states that she cut her hair in her room because it was long. She denies SI/HI. The police that brought her state that they believe that she was coerced in to coming. She denied SI to the police as well.

## 2019-02-23 NOTE — Progress Notes (Addendum)
Received [name] from the main ED, alert and  upon entering her room she was standing behind the door. She verbalized she wanted a hemoglobin test and was told it could be done in the ED. Then she stated after cutting off her hair, giving up the scissors,it was recommended to go to the ED. The  Staff person believed she was trying to harm herself. She was oriented to her new environment, used the bathroom and consumed one cranberry juice before lying down in the bed. She was compliant with her medications and VS checks. Later she received her discharge order, [facility] was called to transport and she was transported without incident. [name] was called x2, but this Probation officer did not get a response, just a full mailbox. Her personal belongings were returned to her.

## 2019-02-24 DIAGNOSIS — F329 Major depressive disorder, single episode, unspecified: Secondary | ICD-10-CM | POA: Diagnosis not present

## 2019-02-24 LAB — URINALYSIS, ROUTINE W REFLEX MICROSCOPIC
Bilirubin Urine: NEGATIVE
Glucose, UA: NEGATIVE mg/dL
Hgb urine dipstick: NEGATIVE
Ketones, ur: NEGATIVE mg/dL
Leukocytes,Ua: NEGATIVE
Nitrite: NEGATIVE
Protein, ur: NEGATIVE mg/dL
Specific Gravity, Urine: 1.006 (ref 1.005–1.030)
pH: 6 (ref 5.0–8.0)

## 2019-02-24 LAB — RAPID URINE DRUG SCREEN, HOSP PERFORMED
Amphetamines: NOT DETECTED
Barbiturates: NOT DETECTED
Benzodiazepines: NOT DETECTED
Cocaine: NOT DETECTED
Opiates: NOT DETECTED
Tetrahydrocannabinol: NOT DETECTED

## 2019-02-24 NOTE — Discharge Instructions (Addendum)
You have been seen by psychiatry who does not feel you need inpatient psychiatric admission and recommend you follow-up with your psychiatrist at [facility].  Please continue your medications as prescribed.

## 2019-02-24 NOTE — BH Assessment (Addendum)
Tele Assessment Note   Patient Name: [name] MRN: [id number] Referring Physician: Dr. Francine Graven Location of Patient: [facility] Location of Provider: [place]  [name] is an 53 y.o. female arriving in from [place] for evaluation. When asked why patient is here, patient reported "they talked me into being evaluated, they were mad because I cut my hair short". Patient reported she only wanted her hemoglobin checked. Patient denied SI, HI and psychosis. Patient denied alcohol and drug usage. Patient reported being asked by [facility] Staff to go to ED several times, so patient went to ED. Which was after she cut her hair from long to short. [place] Staff then thought patient was trying to cut herself with the scissors and then called 911. Patient reported 2018 she was inpatient due to schizoaffective disorder and then patient was transferred to [facility] for residential treatment. Patient has been at [facility] since April 2018. Patient reported childhood mental health issues. Patient reported on and off depressive symptoms.   PER TRIAGE NOTE: Patient reported requesting to have her hemoglobin tested and then cutting her hair from long to short. The police that brought her stated that they believe that she was coerced in to coming. She denied SI, HI, with police as well.  Collateral Contact [facility] Staff [phone], stated patient cut her hair from long to short after taking the scissors when she was not supposed to have them. Staff stated that patient reported men were coming in and out of her room and that people were trying to get her. Staff reported the 1st attempt for police to bring patient to ED, they would not do it and the 2nd attempt they did. Staff stated patient did not communicate SI, but wanted her evaluated regarding her delusions. Staff reported patient is currently seeing a psychiatrist but was not able to give information as she was not able  to find contact information in patients chart at [facility].   Diagnosis: Major depressive disorder  Past Medical History:  Past Medical History:  Diagnosis Date  . Back pain   . Depression   . GERD (gastroesophageal reflux disease)   . High cholesterol   . Schizoaffective disorder ([place])     History reviewed. No pertinent surgical history.  Family History: History reviewed. No pertinent family history.  Social History:  reports that she has never smoked. She does not have any smokeless tobacco history on file. She reports current alcohol use of about 2.0 standard drinks of alcohol per week. She reports that she does not use drugs.  Additional Social History:  Alcohol / Drug Use Pain Medications: see MAR Prescriptions: see MAR Over the Counter: see MAR  CIWA: CIWA-Ar BP: (!) 146/100 Pulse Rate: 71 COWS:    Allergies:  Allergies  Allergen Reactions  . Luvox [Fluvoxamine] Other (See Comments)    Aggitation, irritability, and anger  . Other Other (See Comments)    (Citrus, ascorbic acid, and orange juice) Blisters in mouth and inside other places of the body.   . Sulfa Antibiotics     Not sure of allergy but happened when she used a topical sulfa cream.    Home Medications: (Not in a hospital admission)   OB/GYN Status:  No LMP recorded.  General Assessment Data [facility]: [facility] TTS Assessment: In system Is this a Tele or Face-to-Face Assessment?: Tele Assessment Is this an Initial Assessment or a Re-assessment for this encounter?: Initial Assessment Patient Accompanied  by:: N/A Language Other than English: No Living Arrangements: In Assisted Living/Nursing Home (Comment: Name of [place] ) What gender do you identify as?: Female Marital status: Single Living Arrangements: Other (Comment)([facility]) Can pt return to current living arrangement?: Yes Admission Status: Voluntary Is patient capable of  signing voluntary admission?: Yes Referral Source: Other([facility])   Crisis Care Plan Living Arrangements: Other (Comment)([facility]) Legal Guardian: (self) Name of Psychiatrist: (unknown) Name of Therapist: (unknown)  Education Status Is patient currently in school?: No Is the patient employed, unemployed or receiving disability?: Receiving disability income  Risk to self with the past 6 months Suicidal Ideation: No Has patient been a risk to self within the past 6 months prior to admission? : No Suicidal Intent: No Has patient had any suicidal intent within the past 6 months prior to admission? : No Is patient at risk for suicide?: No Suicidal Plan?: No Has patient had any suicidal plan within the past 6 months prior to admission? : No Access to Means: No What has been your use of drugs/alcohol within the last 12 months?: (denied) Previous Attempts/Gestures: No How many times?: (0) Other Self Harm Risks: (none reported) Triggers for Past Attempts: (n/a) Intentional Self Injurious Behavior: None Family Suicide History: No Recent stressful life event(s): (none) Persecutory voices/beliefs?: No Depression: No Depression Symptoms: Fatigue, Isolating, Loss of interest in usual pleasures Substance abuse history and/or treatment for substance abuse?: No Suicide prevention information given to non-admitted patients: Not applicable  Risk to Others within the past 6 months Homicidal Ideation: No Does patient have any lifetime risk of violence toward others beyond the six months prior to admission? : No Thoughts of Harm to Others: No Current Homicidal Intent: No Current Homicidal Plan: No Access to Homicidal Means: No Identified Victim: (n/la) History of harm to others?: No Assessment of Violence: None Noted Violent Behavior Description: (none reported) Does patient have access to weapons?: No Criminal Charges Pending?:  No Does patient have a court date: No Is patient on probation?: No  Psychosis Hallucinations: None noted Delusions: None noted  Mental Status Report Appearance/Hygiene: Unremarkable Eye Contact: Fair Motor Activity: Freedom of movement Speech: Logical/coherent Level of Consciousness: Alert Mood: Depressed Affect: Depressed Anxiety Level: Minimal Thought Processes: Relevant, Coherent Judgement: Partial Orientation: Person, Place, Time, Situation Obsessive Compulsive Thoughts/Behaviors: None  Cognitive Functioning Concentration: Fair Memory: Recent Intact Is patient IDD: No Insight: Fair Appetite: Poor Have you had any weight changes? : No Change Sleep: Decreased Total Hours of Sleep: (2-3) Vegetative Symptoms: None  ADLScreening [facility] Assessment Services) Patient's cognitive ability adequate to safely complete daily activities?: Yes Patient able to express need for assistance with ADLs?: Yes Independently performs ADLs?: Yes (appropriate for developmental age)  Prior Inpatient Therapy Prior Inpatient Therapy: No  Prior Outpatient Therapy Prior Outpatient Therapy: Yes Prior Therapy Dates: (present) Prior Therapy Facilty/Provider(s): (unknown) Reason for Treatment: (schizoaffective) Does patient have an ACCT team?: No Does patient have Intensive In-House Services?  : No Does patient have [facility] services? : No Does patient have P4CC services?: No  ADL Screening (condition at time of admission) Patient's cognitive ability adequate to safely complete daily activities?: Yes Patient able to express need for assistance with ADLs?: Yes Independently performs ADLs?: Yes (appropriate for developmental age)  Regulatory affairs officer (For Healthcare) Does Patient Have a Medical Advance Directive?: No Would patient like information on creating a medical advance directive?: No - Patient declined    Disposition:  Disposition Initial Assessment Completed for this Encounter: Yes   Lindon Romp, NP, patient does not meet inpatient criteria. Patient to follow up with her outpatient psychiatrist.   This service was provided via telemedicine using a 2-way, interactive audio and video technology.  Names of all persons participating in this telemedicine service and their role in this encounter. Name: Tammela Wallock Role: Patient  Name: Kirtland Bouchard Role: TTS Clinician  Name:  Role:   Name:  Role:     Venora Maples 02/24/2019 12:40 AM

## 2019-04-03 ENCOUNTER — Emergency Department (HOSPITAL_COMMUNITY)
Admission: EM | Admit: 2019-04-03 | Discharge: 2019-04-03 | Disposition: A | Discharge status: Home / Self Care | Payer: Medicare Other | Attending: Emergency Medicine | Admitting: Emergency Medicine

## 2019-04-03 ENCOUNTER — Ambulatory Visit (HOSPITAL_COMMUNITY): Admission: RE | Admit: 2019-04-03 | Payer: Medicare Other | Admitting: Psychiatry

## 2019-04-03 ENCOUNTER — Other Ambulatory Visit: Payer: Self-pay

## 2019-04-03 ENCOUNTER — Encounter (HOSPITAL_COMMUNITY): Payer: Self-pay | Admitting: Emergency Medicine

## 2019-04-03 DIAGNOSIS — Z0489 Encounter for examination and observation for other specified reasons: Secondary | ICD-10-CM | POA: Diagnosis present

## 2019-04-03 DIAGNOSIS — F250 Schizoaffective disorder, bipolar type: Secondary | ICD-10-CM | POA: Diagnosis not present

## 2019-04-03 DIAGNOSIS — R4689 Other symptoms and signs involving appearance and behavior: Secondary | ICD-10-CM

## 2019-04-03 DIAGNOSIS — Z79899 Other long term (current) drug therapy: Secondary | ICD-10-CM | POA: Diagnosis not present

## 2019-04-03 LAB — CBC
HCT: 44.9 % (ref 36.0–46.0)
Hemoglobin: 15 g/dL (ref 12.0–15.0)
MCH: 29.4 pg (ref 26.0–34.0)
MCHC: 33.4 g/dL (ref 30.0–36.0)
MCV: 87.9 fL (ref 80.0–100.0)
Platelets: 259 10*3/uL (ref 150–400)
RBC: 5.11 MIL/uL (ref 3.87–5.11)
RDW: 11.9 % (ref 11.5–15.5)
WBC: 6.7 10*3/uL (ref 4.0–10.5)
nRBC: 0 % (ref 0.0–0.2)

## 2019-04-03 LAB — SALICYLATE LEVEL: Salicylate Lvl: <7 mg/dL (ref 2.8–30.0)

## 2019-04-03 LAB — RAPID URINE DRUG SCREEN, HOSP PERFORMED
Amphetamines: NOT DETECTED
Barbiturates: NOT DETECTED
Benzodiazepines: NOT DETECTED
Cocaine: NOT DETECTED
Opiates: NOT DETECTED
Tetrahydrocannabinol: NOT DETECTED

## 2019-04-03 LAB — COMPREHENSIVE METABOLIC PANEL
ALT: 39 U/L (ref 0–44)
AST: 26 U/L (ref 15–41)
Albumin: 4.6 g/dL (ref 3.5–5.0)
Alkaline Phosphatase: 112 U/L (ref 38–126)
Anion gap: 12 (ref 5–15)
BUN: 18 mg/dL (ref 6–20)
CO2: 22 mmol/L (ref 22–32)
Calcium: 9.3 mg/dL (ref 8.9–10.3)
Chloride: 106 mmol/L (ref 98–111)
Creatinine, Ser: 1 mg/dL (ref 0.44–1.00)
GFR calc Af Amer: >60 mL/min (ref >60)
GFR calc non Af Amer: >60 mL/min (ref >60)
Glucose, Bld: 99 mg/dL (ref 70–99)
Potassium: 3.6 mmol/L (ref 3.5–5.1)
Sodium: 140 mmol/L (ref 135–145)
Total Bilirubin: 0.3 mg/dL (ref 0.3–1.2)
Total Protein: 7.8 g/dL (ref 6.5–8.1)

## 2019-04-03 LAB — ACETAMINOPHEN LEVEL: Acetaminophen (Tylenol), Serum: <10 ug/mL — ABNORMAL LOW (ref 10–30)

## 2019-04-03 LAB — I-STAT BETA HCG BLOOD, ED (MC, WL, AP ONLY): I-stat hCG, quantitative: <5 m[IU]/mL (ref <5)

## 2019-04-03 LAB — ETHANOL: Alcohol, Ethyl (B): <10 mg/dL (ref <10)

## 2019-04-03 MED ORDER — CLONAZEPAM 1 MG PO TABS: 1.0000 mg | ORAL_TABLET | Freq: Every day | ORAL | Status: DC

## 2019-04-03 MED ORDER — BUPROPION HCL ER (XL) 150 MG PO TB24
150.0000 mg | ORAL_TABLET | Freq: Every day | ORAL | Status: DC
  Administered 2019-04-03: 150 mg via ORAL
  Filled 2019-04-03: qty 1

## 2019-04-03 MED ORDER — AMLODIPINE BESYLATE 5 MG PO TABS
5.0000 mg | ORAL_TABLET | Freq: Every day | ORAL | Status: DC
  Administered 2019-04-03: 5 mg via ORAL
  Filled 2019-04-03: qty 1

## 2019-04-03 MED ORDER — ATORVASTATIN CALCIUM 20 MG PO TABS
20.0000 mg | ORAL_TABLET | Freq: Every day | ORAL | Status: DC
  Filled 2019-04-03: qty 1

## 2019-04-03 MED ORDER — QUETIAPINE FUMARATE 50 MG PO TABS: 50.0000 mg | ORAL_TABLET | ORAL | Status: DC

## 2019-04-03 NOTE — BH Assessment (Signed)
[facility] Assessment Progress Note  Per Neita Garnet, MD, this pt does not require psychiatric hospitalization at this time.  Pt presents under IVC initiated by pt's social worker which Dr Parke Poisson has rescinded.  Pt is to be discharged from [facility] to return to her residential facility.  No outpatient behavioral health referrals are indicated.  This Probation officer has handed off to [employer], Elizabeth who agrees to follow up with pt's guardian and her residential facility.  Pt's nurse, Diane, has been notified.  Jalene Mullet, [place] Triage Specialist [phone]

## 2019-04-03 NOTE — ED Notes (Signed)
Messaged Dr. Parke Poisson and Earleen Newport NP at [facility] to please send the rescinded IVC paperwork by fax to [place] for the chart so that we can let pt return to her home.

## 2019-04-03 NOTE — ED Notes (Signed)
Per TTS pt will be a boarder. Her residence will not take her back.

## 2019-04-03 NOTE — ED Notes (Signed)
[place] and let them know that pt is coming home by [employer]. Again, her guardian mother is aware.

## 2019-04-03 NOTE — ED Notes (Signed)
Spoke with pt's mother who is her guardian and informed her that pt will return to [facility] as reported to me by [employer].  She cannot drive at night but said that if [employer] cannot take pt back to her residence, then pt is ok to go in the hospital transport.

## 2019-04-03 NOTE — ED Notes (Addendum)
According to Admission Record from [facility], pt has a legal guardian: [name] [phone], her mother. This Probation officer attempted to notify her guardian that she is here, as well as verify the information. There was no answer.

## 2019-04-03 NOTE — ED Provider Notes (Signed)
[facility] DEPT Provider Note   CSN: [id number] Arrival date & time: 04/03/19  1232     History   Chief Complaint Chief Complaint  Patient presents with  . Medical Clearance    HPI [name] is a 53 y.o. female.     The history is provided by the patient.  Mental Health Problem Presenting symptoms: aggressive behavior   Patient accompanied by:  Law enforcement Degree of incapacity (severity):  Mild Onset quality:  Gradual Timing:  Intermittent Progression:  Waxing and waning Chronicity:  Recurrent Context: not noncompliant   Treatment compliance:  All of the time Associated symptoms: no abdominal pain, no anxiety and no chest pain   Risk factors: hx of mental illness     Past Medical History:  Diagnosis Date  . Back pain   . Depression   . GERD (gastroesophageal reflux disease)   . High cholesterol   . Schizoaffective disorder [facility])     Patient Active Problem List   Diagnosis Date Noted  . Anxiety 05/12/2012  . Major depression, recurrent ([place]) 05/06/2012    History reviewed. No pertinent surgical history.   OB History   No obstetric history on file.      Home Medications    Prior to Admission medications   Medication Sig Start Date End Date Taking? Authorizing Provider  amLODipine (NORVASC) 5 MG tablet Take 5 mg by mouth daily.   Yes [provider]  atorvastatin (LIPITOR) 20 MG tablet Take 1 tablet (20 mg total) by mouth at bedtime. For hyperlipidemia. 05/15/12  Yes Mashburn, Marlane Hatcher, PA-C  buPROPion (WELLBUTRIN XL) 150 MG 24 hr tablet Take 150 mg by mouth daily.   Yes [provider]  calcium carbonate (CALCIUM 600) 1500 (600 Ca) MG TABS tablet Take 3,000 mg by mouth daily.   Yes [provider]  clonazePAM (KLONOPIN) 1 MG tablet Take 1 tablet (1 mg total) by mouth 2 (two) times daily. For anxiety. Patient taking differently: Take 1 mg by mouth at bedtime.  05/15/12  Yes Mashburn, Marlane Hatcher,  PA-C  QUEtiapine (SEROQUEL) 25 MG tablet Take 50-200 mg by mouth See admin instructions. Take 50mg  in am and 200mg  at bedtime   Yes [provider]  ARIPiprazole (ABILIFY) 10 MG tablet Take 1 tablet (10 mg total) by mouth daily. For mental clarity. Patient not taking: Reported on 02/23/2019 05/15/12   Nena Polio T, PA-C  buPROPion [facility]) 200 MG 12 hr tablet Take 1 tablet (200 mg total) by mouth 2 (two) times daily. For depression at 7 AM and 1PM. Patient not taking: Reported on 02/23/2019 05/15/12   Nena Polio T, PA-C  famciclovir (FAMVIR) 250 MG tablet Take 1 tablet (250 mg total) by mouth at bedtime. For herpes. Patient not taking: Reported on 02/23/2019 05/15/12   Nena Polio T, PA-C  gabapentin (NEURONTIN) 300 MG capsule Take 1 capsule (300 mg total) by mouth at bedtime. For anxiety and neuropathic pain. Patient not taking: Reported on 02/23/2019 05/15/12   Nena Polio T, PA-C  omeprazole (PRILOSEC) 40 MG capsule Take 1 capsule (40 mg total) by mouth daily. For reflux. Patient not taking: Reported on 02/23/2019 05/15/12   Nena Polio T, PA-C  PARoxetine (PAXIL) 30 MG tablet Take 1 tablet (30 mg total) by mouth at bedtime. For anxiety and depression. Patient not taking: Reported on 02/23/2019 05/15/12   Nena Polio T, PA-C  traZODone (DESYREL) 50 MG tablet Take 1 tablet (50 mg total) by mouth  at bedtime as needed for sleep (May repeat x 1 if needed). Patient not taking: Reported on 02/23/2019 05/15/12   Pamelia Hoit    Family History History reviewed. No pertinent family history.  Social History Social History   Tobacco Use  . Smoking status: Never Smoker  Substance Use Topics  . Alcohol use: Yes    Alcohol/week: 2.0 standard drinks    Types: 2 drink(s) per week  . Drug use: No     Allergies   Luvox [fluvoxamine], Other, and Sulfa antibiotics   Review of Systems Review of Systems  Constitutional: Negative for chills and fever.   HENT: Negative for ear pain and sore throat.   Eyes: Negative for pain and visual disturbance.  Respiratory: Negative for cough and shortness of breath.   Cardiovascular: Negative for chest pain and palpitations.  Gastrointestinal: Negative for abdominal pain and vomiting.  Genitourinary: Negative for dysuria and hematuria.  Musculoskeletal: Negative for arthralgias and back pain.  Skin: Negative for color change and rash.  Neurological: Negative for seizures and syncope.  Psychiatric/Behavioral: The patient is not nervous/anxious and is not hyperactive.   All other systems reviewed and are negative.    Physical Exam Updated Vital Signs  ED Triage Vitals  Enc Vitals Group     BP 04/03/19 1313 131/83     Pulse Rate 04/03/19 1313 76     Resp 04/03/19 1313 20     Temp 04/03/19 1313 98.6 F (37 C)     Temp Source 04/03/19 1313 Oral     SpO2 04/03/19 1313 97 %     Weight --      Height --      Head Circumference --      Peak Flow --      Pain Score 04/03/19 1245 0     Pain Loc --      Pain Edu? --      Excl. in [place]? --     Physical Exam Vitals signs and nursing note reviewed.  Constitutional:      General: She is not in acute distress.    Appearance: She is well-developed.  HENT:     Head: Normocephalic and atraumatic.     Nose: Nose normal.     Mouth/Throat:     Mouth: Mucous membranes are moist.  Eyes:     Extraocular Movements: Extraocular movements intact.     Conjunctiva/sclera: Conjunctivae normal.     Pupils: Pupils are equal, round, and reactive to light.  Neck:     Musculoskeletal: Normal range of motion and neck supple.  Cardiovascular:     Rate and Rhythm: Normal rate and regular rhythm.     Pulses: Normal pulses.     Heart sounds: Normal heart sounds. No murmur.  Pulmonary:     Effort: Pulmonary effort is normal. No respiratory distress.     Breath sounds: Normal breath sounds.  Abdominal:     Palpations: Abdomen is soft.     Tenderness: There is no  abdominal tenderness.  Skin:    General: Skin is warm and dry.     Capillary Refill: Capillary refill takes less than 2 seconds.  Neurological:     General: No focal deficit present.     Mental Status: She is alert.  Psychiatric:        Mood and Affect: Mood normal.        Behavior: Behavior normal.        Thought Content: Thought content  normal.        Judgment: Judgment normal.      ED Treatments / Results  Labs (all labs ordered are listed, but only abnormal results are displayed) Labs Reviewed  ACETAMINOPHEN LEVEL - Abnormal; Notable for the following components:      Result Value   Acetaminophen (Tylenol), Serum <10 (*)    All other components within normal limits  COMPREHENSIVE METABOLIC PANEL  ETHANOL  SALICYLATE LEVEL  CBC  RAPID URINE DRUG SCREEN, HOSP PERFORMED  I-STAT BETA HCG BLOOD, ED ([facility], [facility], [facility] ONLY)    EKG None  Radiology No results found.  Procedures Procedures (including critical care time)  Medications Ordered in ED Medications  amLODipine (NORVASC) tablet 5 mg (has no administration in time range)  atorvastatin (LIPITOR) tablet 20 mg (has no administration in time range)  buPROPion (WELLBUTRIN XL) 24 hr tablet 150 mg (has no administration in time range)  clonazePAM (KLONOPIN) tablet 1 mg (has no administration in time range)  QUEtiapine (SEROQUEL) tablet 50-200 mg (has no administration in time range)     Initial Impression / Assessment and Plan / ED Course  I have reviewed the triage vital signs and the nursing notes.  Pertinent labs & imaging results that were available during my care of the patient were reviewed by me and considered in my medical decision making (see chart for details).     Tian Stelmack is a 53 year old female with history of schizoaffective disorder, depression, high cholesterol who presents the ED with IVC in place from her housing facility.  Patient with normal vitals.  No fever.  Patient overall appears well.  IVC was  filled out by her nursing facility for aggressive behavior.  Patient is here for medical clearance and psych evaluation.  Overall medical clearance labs are unremarkable.  Patient appears stable and calm.  She has no suicidal homicidal ideation.  We will have her evaluated by psychiatry.  Will touch base with our social work as anticipate discharge back to her facility.  Awaiting final psychiatric clearance.  This chart was dictated using voice recognition software.  Despite best efforts to proofread,  errors can occur which can change the documentation meaning.    Final Clinical Impressions(s) / ED Diagnoses   Final diagnoses:  Aggressive behavior    ED Discharge Orders    None       Lennice Sites, DO 04/03/19 1400
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
## 2019-04-03 NOTE — Progress Notes (Addendum)
2:22p CSW spoke with patient's mother/legal guardian, [name], who asked if patient was at [facility] or [facility]. CSW informed Ms. Hassell Done that patient was a [employer]. Ms. Hassell Done reports she does have legal guardian paperwork and will get it to [place]. Ms. Hassell Done asked if someone from the psych team could call her to pass along collateral information on patient's behavior while at [facility]. Ms. Hassell Done reports she hopes patient can be evaluated and get back on her medication as she reports patient is non-compliant with medications which leads her to have negative behaviors. CSW explained the psych evaluation process and also informed her if patient is psych cleared we will have to send patient back to [facility].   1:55p CSW reiceived consult and spoke with EDP Dr. Ronnald Nian about this patient. Per Dr. Ronnald Nian, patient is from [facility] and was IVC. Dr. Ronnald Nian asked if CSW could speak with Mendel Corning to ensure patient is able to come back once psychiatrically cleared. TTS consult has been put in.   CSW spoke with Daneil Dan (main number [phone]), social worker at [facility], who filed the [employer]. Per Ms. Owens Shark, they will evaluate if patient is able to come back once psych eval is done to see if there were any changes made. Ms. Owens Shark reports this is patient's second time in the ED for a psych eval within the past month (per chart patient was seen and psych cleared on 02/24/2019). Ms. Owens Shark asked if psych eval could be sent to her through the Epic hub, if unsuccessful sending information through the hub, information can be faxed to [phone]. CSW to follow up.   Golden Circle, LCSW Transitions of Care Department [facility] ED [phone]

## 2019-04-03 NOTE — ED Provider Notes (Signed)
  Physical Exam  BP 131/83 (BP Location: Right Arm)   Pulse 76   Temp 98.6 F (37 C) (Oral)   Resp 20   SpO2 97%   Physical Exam  ED Course/Procedures     Procedures  MDM  Received care of pt from Dr. Ronnald Nian.  Presented IVCd by facility for aggressive behavior. Has been medically cleared. Evaluated by psychiatry and felt appropriate for outpt treatment.  Dr. Parke Poisson rescinded IVC.  [place] CSW discussed with facility who has accepted the patient and discussed with patient's mother.   Gareth Morgan, MD 04/04/19 [phone]
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
## 2019-04-03 NOTE — ED Notes (Signed)
Pt is cooperative, but suspicious.

## 2019-04-03 NOTE — ED Notes (Signed)
Transported home by [employer] who brought her in for her safety. Her mother could not take her home because she cannot see to drive at night.
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
## 2019-04-18 ENCOUNTER — Emergency Department (HOSPITAL_COMMUNITY)
Admission: EM | Admit: 2019-04-18 | Discharge: 2019-04-20 | Disposition: A | Discharge status: Other Acute Inpatient Hospital | Payer: Medicare Other | Attending: Emergency Medicine | Admitting: Emergency Medicine

## 2019-04-18 ENCOUNTER — Ambulatory Visit (HOSPITAL_COMMUNITY)
Admission: AD | Admit: 2019-04-18 | Discharge: 2019-04-18 | Disposition: A | Discharge status: Home / Self Care | Payer: Medicare Other | Attending: Psychiatry | Admitting: Psychiatry

## 2019-04-18 ENCOUNTER — Other Ambulatory Visit: Payer: Self-pay

## 2019-04-18 ENCOUNTER — Encounter (HOSPITAL_COMMUNITY): Payer: Self-pay

## 2019-04-18 DIAGNOSIS — F339 Major depressive disorder, recurrent, unspecified: Secondary | ICD-10-CM | POA: Diagnosis present

## 2019-04-18 DIAGNOSIS — Z79899 Other long term (current) drug therapy: Secondary | ICD-10-CM | POA: Diagnosis not present

## 2019-04-18 DIAGNOSIS — F259 Schizoaffective disorder, unspecified: Secondary | ICD-10-CM | POA: Insufficient documentation

## 2019-04-18 DIAGNOSIS — F250 Schizoaffective disorder, bipolar type: Secondary | ICD-10-CM | POA: Insufficient documentation

## 2019-04-18 DIAGNOSIS — Z20828 Contact with and (suspected) exposure to other viral communicable diseases: Secondary | ICD-10-CM | POA: Diagnosis not present

## 2019-04-18 DIAGNOSIS — Z046 Encounter for general psychiatric examination, requested by authority: Secondary | ICD-10-CM | POA: Diagnosis present

## 2019-04-18 DIAGNOSIS — R4587 Impulsiveness: Secondary | ICD-10-CM | POA: Insufficient documentation

## 2019-04-18 DIAGNOSIS — F29 Unspecified psychosis not due to a substance or known physiological condition: Secondary | ICD-10-CM

## 2019-04-18 DIAGNOSIS — F22 Delusional disorders: Secondary | ICD-10-CM | POA: Insufficient documentation

## 2019-04-18 DIAGNOSIS — G4700 Insomnia, unspecified: Secondary | ICD-10-CM | POA: Insufficient documentation

## 2019-04-18 LAB — COMPREHENSIVE METABOLIC PANEL
ALT: 63 U/L — ABNORMAL HIGH (ref 0–44)
AST: 29 U/L (ref 15–41)
Albumin: 4.7 g/dL (ref 3.5–5.0)
Alkaline Phosphatase: 117 U/L (ref 38–126)
Anion gap: 13 (ref 5–15)
BUN: 17 mg/dL (ref 6–20)
CO2: 23 mmol/L (ref 22–32)
Calcium: 9.6 mg/dL (ref 8.9–10.3)
Chloride: 104 mmol/L (ref 98–111)
Creatinine, Ser: 1.06 mg/dL — ABNORMAL HIGH (ref 0.44–1.00)
GFR calc Af Amer: >60 mL/min (ref >60)
GFR calc non Af Amer: >60 mL/min (ref >60)
Glucose, Bld: 116 mg/dL — ABNORMAL HIGH (ref 70–99)
Potassium: 3.7 mmol/L (ref 3.5–5.1)
Sodium: 140 mmol/L (ref 135–145)
Total Bilirubin: 0.7 mg/dL (ref 0.3–1.2)
Total Protein: 7.8 g/dL (ref 6.5–8.1)

## 2019-04-18 LAB — CBC
HCT: 48.1 % — ABNORMAL HIGH (ref 36.0–46.0)
Hemoglobin: 16.1 g/dL — ABNORMAL HIGH (ref 12.0–15.0)
MCH: 29.7 pg (ref 26.0–34.0)
MCHC: 33.5 g/dL (ref 30.0–36.0)
MCV: 88.7 fL (ref 80.0–100.0)
Platelets: 266 10*3/uL (ref 150–400)
RBC: 5.42 MIL/uL — ABNORMAL HIGH (ref 3.87–5.11)
RDW: 11.9 % (ref 11.5–15.5)
WBC: 6.8 10*3/uL (ref 4.0–10.5)
nRBC: 0 % (ref 0.0–0.2)

## 2019-04-18 LAB — RAPID URINE DRUG SCREEN, HOSP PERFORMED
Amphetamines: NOT DETECTED
Barbiturates: NOT DETECTED
Benzodiazepines: POSITIVE — AB
Cocaine: NOT DETECTED
Opiates: NOT DETECTED
Tetrahydrocannabinol: NOT DETECTED

## 2019-04-18 LAB — ETHANOL: Alcohol, Ethyl (B): <10 mg/dL (ref <10)

## 2019-04-18 LAB — I-STAT BETA HCG BLOOD, ED (MC, WL, AP ONLY): I-stat hCG, quantitative: <5 m[IU]/mL (ref <5)

## 2019-04-18 LAB — SALICYLATE LEVEL: Salicylate Lvl: <7 mg/dL (ref 2.8–30.0)

## 2019-04-18 LAB — ACETAMINOPHEN LEVEL: Acetaminophen (Tylenol), Serum: <10 ug/mL — ABNORMAL LOW (ref 10–30)

## 2019-04-18 MED ORDER — CALCIUM CARBONATE 1250 (500 CA) MG PO TABS
2500.0000 mg | ORAL_TABLET | Freq: Every day | ORAL | Status: DC
  Administered 2019-04-18: 1250 mg via ORAL
  Administered 2019-04-19: 2500 mg via ORAL
  Filled 2019-04-18 (×3): qty 2

## 2019-04-18 MED ORDER — QUETIAPINE FUMARATE 50 MG PO TABS: 50.0000 mg | ORAL_TABLET | ORAL | Status: DC

## 2019-04-18 MED ORDER — CLONAZEPAM 1 MG PO TABS
1.0000 mg | ORAL_TABLET | Freq: Two times a day (BID) | ORAL | Status: DC
  Administered 2019-04-18 – 2019-04-19 (×3): 1 mg via ORAL
  Filled 2019-04-18 (×5): qty 1

## 2019-04-18 MED ORDER — BUPROPION HCL ER (XL) 150 MG PO TB24
150.0000 mg | ORAL_TABLET | Freq: Every day | ORAL | Status: DC
  Administered 2019-04-18 – 2019-04-19 (×2): 150 mg via ORAL
  Filled 2019-04-18 (×3): qty 1

## 2019-04-18 MED ORDER — ATORVASTATIN CALCIUM 20 MG PO TABS
20.0000 mg | ORAL_TABLET | Freq: Every day | ORAL | Status: DC
  Administered 2019-04-18 – 2019-04-19 (×2): 20 mg via ORAL
  Filled 2019-04-18 (×2): qty 1

## 2019-04-18 MED ORDER — AMLODIPINE BESYLATE 5 MG PO TABS
5.0000 mg | ORAL_TABLET | Freq: Every day | ORAL | Status: DC
  Administered 2019-04-18 – 2019-04-19 (×2): 5 mg via ORAL
  Filled 2019-04-18 (×3): qty 1

## 2019-04-18 NOTE — ED Notes (Signed)
Pt attempting to give urine specimen

## 2019-04-18 NOTE — ED Notes (Addendum)
Pt ambulatory to room 43 w/o difficulty
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
## 2019-04-18 NOTE — ED Notes (Signed)
Pt resting quietly, alert/oriented, denies si/hi/avh at this itme. Procedures explained.

## 2019-04-18 NOTE — BH Assessment (Signed)
[facility] Assessment Progress Note  Per Neita Garnet, MD, this pt requires psychiatric hospitalization at this time.  Pt presents under IVC initiated by the Clint Lipps [phone]), Director of Nursing at [facility] where pt lives.  Dr Parke Poisson has upheld it.  This Probation officer will seek placement for pt.  At 14:55 I called pt's mother/legal guardian, [name], and notified her of pt's current disposition.   Jalene Mullet, [place] Coordinator [phone]

## 2019-04-18 NOTE — ED Notes (Signed)
Up to the bathroom 

## 2019-04-18 NOTE — BH Assessment (Signed)
[facility] Assessment Progress Note  Per Neita Garnet, MD, this pt requires psychiatric hospitalization at this time.  Pt presents under IVC initiated by the Clint Lipps [phone]), Director of Nursing at [facility] where pt lives.  Dr Parke Poisson has upheld it.  The following facilities have been contacted to seek placement for this pt, with results as noted:  Beds available, information sent, decision pending:  [facility]   At capacity:  [place], [place] Coordinator [phone]
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
## 2019-04-18 NOTE — ED Notes (Addendum)
Pt dressed out, wanded by security and belongings placed at nurses station in triage.
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
## 2019-04-18 NOTE — BH Assessment (Signed)
Pt's mother/legal guardian   [name], mother/legal guardian: [phone]

## 2019-04-19 DIAGNOSIS — F339 Major depressive disorder, recurrent, unspecified: Secondary | ICD-10-CM | POA: Diagnosis not present

## 2019-04-19 LAB — SARS CORONAVIRUS 2 (TAT 6-24 HRS): SARS Coronavirus 2: NEGATIVE

## 2019-04-19 MED ORDER — QUETIAPINE FUMARATE 50 MG PO TABS
50.0000 mg | ORAL_TABLET | Freq: Every day | ORAL | Status: DC
  Administered 2019-04-19: 50 mg via ORAL
  Filled 2019-04-19: qty 1

## 2019-04-19 MED ORDER — QUETIAPINE FUMARATE 50 MG PO TABS
50.0000 mg | ORAL_TABLET | Freq: Two times a day (BID) | ORAL | Status: DC
  Filled 2019-04-19 (×2): qty 1

## 2019-04-19 NOTE — ED Notes (Signed)
Aaron Edelman, MHT given handoff report

## 2019-04-19 NOTE — BH Assessment (Signed)
Patient referred to the following facilities for placement (under review):  [facility] Details [facility] Details [facility] Details [facility] Details [facility] Details [facility] Details [facility] Details [facility] Details [facility] Details [facility] Details [facility] Details [facility] Details [facility]

## 2019-04-19 NOTE — BH Assessment (Signed)
[facility] Assessment Progress Note  Per Letitia Libra, FNP, this pt continues to require psychiatric hospitalization at this time.  Pt remains under IVC.  [facility] reports that they did not receive referral sent yesterday, 04/18/2019. Referral has been re-sent.  Jalene Mullet, [place] Coordinator [phone]

## 2019-04-19 NOTE — ED Notes (Signed)
Pt Awake, alert & responsive, no distress noted, resting at present.  Monitoring for safety. Calm at present.

## 2019-04-19 NOTE — BH Assessment (Signed)
Per Jonelle Sidle, patient accepted to [facility] for admission 04/20/2019 after 9am. Nursing report [phone]. Patient's nurse Latricia made aware.

## 2019-04-19 NOTE — ED Notes (Signed)
Pt sitting on bed. Cooperative.  Pt laughing inappropriately.

## 2019-04-19 NOTE — Consult Note (Signed)
Telepsych Consultation   Reason for Consult:  Involuntary Commitment initiated by living facility Referring Physician:  EDP Location of Patient:  Location of Provider: [facility]  Patient Identification: [name] MRN:  [id number] Principal Diagnosis: Major depression, recurrent ([place]) Diagnosis:  Principal Problem:   Major depression, recurrent ([place])   Total Time spent with patient: 30 minutes  Subjective:   [name] is a 53 y.o. female patient admitted with delusions and aggressive behavior. Patient cooperative in emergency department. Patient alert and oriented, answers questions appropriately. Patient denies suicidal and homicidal ideations. Patient complains "people were always coming into my room (at assisted living facility) when I was asleep I could not see them and when I was awake I could not see them." Patient describes "when a pencil is taken from my hand and another pencil is put into my hand while I am writing with it and I don't see anything something is strange."   HPI:  Patient from assisted living facility where she barricaded self in room, threw a broken phone at staff and was physically aggressive with staff related to paranoid delusions someone was coming into her room when she could not see them."   Past Psychiatric History: MDD with psychotic features  Risk to Self:  Yes Risk to Others:  Yes Prior Inpatient Therapy:   Prior Outpatient Therapy:    Past Medical History:  Past Medical History:  Diagnosis Date  . Back pain   . Depression   . GERD (gastroesophageal reflux disease)   . High cholesterol   . Schizoaffective disorder ([place])    History reviewed. No pertinent surgical history. Family History: History reviewed. No pertinent family history. Family Psychiatric  History: Unknown Social History:  Social History   Substance and Sexual Activity  Alcohol Use Yes  . Alcohol/week: 2.0 standard drinks  . Types: 2 drink(s) per week      Social History   Substance and Sexual Activity  Drug Use No    Social History   Socioeconomic History  . Marital status: Divorced    Spouse name: Not on file  . Number of children: Not on file  . Years of education: Not on file  . Highest education level: Not on file  Occupational History  . Not on file  Social Needs  . Financial resource strain: Not on file  . Food insecurity    Worry: Not on file    Inability: Not on file  . Transportation needs    Medical: Not on file    Non-medical: Not on file  Tobacco Use  . Smoking status: Never Smoker  Substance and Sexual Activity  . Alcohol use: Yes    Alcohol/week: 2.0 standard drinks    Types: 2 drink(s) per week  . Drug use: No  . Sexual activity: Not Currently    Birth control/protection: None  Lifestyle  . Physical activity    Days per week: Not on file    Minutes per session: Not on file  . Stress: Not on file  Relationships  . Social Herbalist on phone: Not on file    Gets together: Not on file    Attends religious service: Not on file    Active member of club or organization: Not on file    Attends meetings of clubs or organizations: Not on file    Relationship status: Not on file  Other Topics Concern  . Not on file  Social History Narrative  . Not  on file   Additional Social History:    Allergies:   Allergies  Allergen Reactions  . Luvox [Fluvoxamine] Other (See Comments)    Aggitation, irritability, and anger  . Other Other (See Comments)    (Citrus, ascorbic acid, and orange juice) Blisters in mouth and inside other places of the body.   . Sulfa Antibiotics     Not sure of allergy but happened when she used a topical sulfa cream.    Labs:  Results for orders placed or performed during the hospital encounter of 04/18/19 (from the past 48 hour(s))  Comprehensive metabolic panel     Status: Abnormal   Collection Time: 04/18/19  3:39 AM  Result Value Ref Range   Sodium 140 135 - 145  mmol/L   Potassium 3.7 3.5 - 5.1 mmol/L   Chloride 104 98 - 111 mmol/L   CO2 23 22 - 32 mmol/L   Glucose, Bld 116 (H) 70 - 99 mg/dL   BUN 17 6 - 20 mg/dL   Creatinine, Ser 1.06 (H) 0.44 - 1.00 mg/dL   Calcium 9.6 8.9 - 10.3 mg/dL   Total Protein 7.8 6.5 - 8.1 g/dL   Albumin 4.7 3.5 - 5.0 g/dL   AST 29 15 - 41 U/L   ALT 63 (H) 0 - 44 U/L   Alkaline Phosphatase 117 38 - 126 U/L   Total Bilirubin 0.7 0.3 - 1.2 mg/dL   GFR calc non Af Amer >60 >60 mL/min   GFR calc Af Amer >60 >60 mL/min   Anion gap 13 5 - 15    Comment: Performed at [facility], [place] [address] [place], [place] [address]  Ethanol     Status: None   Collection Time: 04/18/19  3:39 AM  Result Value Ref Range   Alcohol, Ethyl (B) <10 <10 mg/dL    Comment: (NOTE) Lowest detectable limit for serum alcohol is 10 mg/dL. For medical purposes only. Performed at [facility], [place] [address], [place], [place] [id number]   Salicylate level     Status: None   Collection Time: 04/18/19  3:39 AM  Result Value Ref Range   Salicylate Lvl [id number] 2.8 - 30.0 mg/dL    Comment: Performed at [facility], [place] [address], [place], [place] [address]  Acetaminophen level     Status: Abnormal   Collection Time: 04/18/19  3:39 AM  Result Value Ref Range   Acetaminophen (Tylenol), Serum <10 (L) 10 - 30 ug/mL    Comment: (NOTE) Therapeutic concentrations vary significantly. A range of 10-30 ug/mL  may be an effective concentration for many patients. However, some  are best treated at concentrations outside of this range. Acetaminophen concentrations >150 ug/mL at 4 hours after ingestion  and >50 ug/mL at 12 hours after ingestion are often associated with  toxic reactions. Performed at [facility], [place] [address] [place],  [address]   cbc     Status: Abnormal   Collection Time: 04/18/19  3:39 AM  Result Value Ref Range   WBC 6.8 4.0 - 10.5 K/uL   RBC  5.42 (H) 3.87 - 5.11 MIL/uL   Hemoglobin 16.1 (H) 12.0 - 15.0 g/dL   HCT 48.1 (H) 36.0 - 46.0 %   MCV 88.7 80.0 - 100.0 fL   MCH 29.7 26.0 - 34.0 pg   MCHC 33.5 30.0 - 36.0 g/dL   RDW 11.9 11.5 - 15.5 %   Platelets 266 150 - 400  K/uL   nRBC 0.0 0.0 - 0.2 %    Comment: Performed at [facility], [place] [address] [place], [place] [address]  Rapid urine drug screen (hospital performed)     Status: Abnormal   Collection Time: 04/18/19  3:39 AM  Result Value Ref Range   Opiates NONE DETECTED NONE DETECTED   Cocaine NONE DETECTED NONE DETECTED   Benzodiazepines POSITIVE (A) NONE DETECTED   Amphetamines NONE DETECTED NONE DETECTED   Tetrahydrocannabinol NONE DETECTED NONE DETECTED   Barbiturates NONE DETECTED NONE DETECTED    Comment: (NOTE) DRUG SCREEN FOR MEDICAL PURPOSES ONLY.  IF CONFIRMATION IS NEEDED FOR ANY PURPOSE, NOTIFY LAB WITHIN 5 DAYS. LOWEST DETECTABLE LIMITS FOR URINE DRUG SCREEN Drug Class                     Cutoff (ng/mL) Amphetamine and metabolites    1000 Barbiturate and metabolites    200 Benzodiazepine                 [id number] Tricyclics and metabolites     300 Opiates and metabolites        300 Cocaine and metabolites        300 THC                            50 Performed at [facility], [place] [address] [place], [place] [address]   I-Stat beta hCG blood, ED     Status: None   Collection Time: 04/18/19  3:50 AM  Result Value Ref Range   I-stat hCG, quantitative <5.0 <5 mIU/mL   Comment 3            Comment:   GEST. AGE      CONC.  (mIU/mL)   <=1 WEEK        5 - 50     2 WEEKS       50 - 500     3 WEEKS       100 - 10,000     4 WEEKS     1,000 - 30,000        FEMALE AND NON-PREGNANT FEMALE:     LESS THAN 5 mIU/mL     Medications:  Current Facility-Administered Medications  Medication Dose Route Frequency Provider Last Rate Last Dose  . amLODipine (NORVASC) tablet 5 mg  5 mg Oral Daily Suella Broad A, PA-C   5 mg at  04/19/19 [id number]  . atorvastatin (LIPITOR) tablet 20 mg  20 mg Oral QHS Suella Broad A, PA-C   20 mg at 04/18/19 2241  . buPROPion (WELLBUTRIN XL) 24 hr tablet 150 mg  150 mg Oral Daily Suella Broad A, PA-C   150 mg at 04/19/19 [id number]  . calcium carbonate (OS-CAL - dosed in mg of elemental calcium) tablet 2,500 mg  2,500 mg Oral Daily Suella Broad A, PA-C   2,500 mg at 04/19/19 [id number]  . clonazePAM (KLONOPIN) tablet 1 mg  1 mg Oral BID Suella Broad A, PA-C   1 mg at 04/18/19 2241  . QUEtiapine (SEROQUEL) tablet 50-200 mg  50-200 mg Oral See admin instructions Tacy Learn, PA-C       Current Outpatient Medications  Medication Sig Dispense Refill  . amLODipine (NORVASC) 5 MG tablet Take 5 mg by mouth daily.    Marland Kitchen atorvastatin (LIPITOR) 20 MG tablet Take 1 tablet (20 mg total) by mouth at  bedtime. For hyperlipidemia.    Marland Kitchen buPROPion (WELLBUTRIN XL) 150 MG 24 hr tablet Take 150 mg by mouth daily.    . calcium carbonate (CALCIUM 600) 1500 (600 Ca) MG TABS tablet Take 3,000 mg by mouth daily.    . clonazePAM (KLONOPIN) 1 MG tablet Take 1 tablet (1 mg total) by mouth 2 (two) times daily. For anxiety. (Patient taking differently: Take 1 mg by mouth at bedtime. ) 60 tablet 0  . QUEtiapine (SEROQUEL) 25 MG tablet Take 50-200 mg by mouth See admin instructions. Take 50mg  in am and 75mg  at bedtime. May take an additional 50 MG twice daily if needed for agitation    . ARIPiprazole (ABILIFY) 10 MG tablet Take 1 tablet (10 mg total) by mouth daily. For mental clarity. (Patient not taking: Reported on 02/23/2019) 30 tablet 0  . buPROPion (WELLBUTRIN SR) 200 MG 12 hr tablet Take 1 tablet (200 mg total) by mouth 2 (two) times daily. For depression at 7 AM and 1PM. (Patient not taking: Reported on 02/23/2019) 60 tablet 0  . famciclovir (FAMVIR) 250 MG tablet Take 1 tablet (250 mg total) by mouth at bedtime. For herpes. (Patient not taking: Reported on 02/23/2019)    . gabapentin (NEURONTIN) 300 MG capsule Take 1 capsule  (300 mg total) by mouth at bedtime. For anxiety and neuropathic pain. (Patient not taking: Reported on 02/23/2019) 30 capsule 0  . omeprazole (PRILOSEC) 40 MG capsule Take 1 capsule (40 mg total) by mouth daily. For reflux. (Patient not taking: Reported on 02/23/2019)    . PARoxetine (PAXIL) 30 MG tablet Take 1 tablet (30 mg total) by mouth at bedtime. For anxiety and depression. (Patient not taking: Reported on 02/23/2019) 30 tablet 0  . traZODone (DESYREL) 50 MG tablet Take 1 tablet (50 mg total) by mouth at bedtime as needed for sleep (May repeat x 1 if needed). (Patient not taking: Reported on 02/23/2019) 30 tablet 0    Musculoskeletal: Strength & Muscle Tone: within normal limits Gait & Station: normal Patient leans: N/A  Psychiatric Specialty Exam: Physical Exam  Nursing note and vitals reviewed. Constitutional: She is oriented to person, place, and time. She appears well-developed.  HENT:  Head: Normocephalic.  Cardiovascular: Normal rate.  Respiratory: Effort normal.  Neurological: She is alert and oriented to person, place, and time.  Psychiatric: She has a normal mood and affect. Her speech is normal and behavior is normal. Thought content is paranoid and delusional. Cognition and memory are normal. She expresses impulsivity.    Review of Systems  Psychiatric/Behavioral: Positive for hallucinations.    Blood pressure 124/83, pulse 75, temperature 99.1 F (37.3 C), temperature source Oral, resp. rate 16, last menstrual period 04/23/2012, SpO2 96 %.There is no height or weight on file to calculate BMI.  General Appearance: Casual  Eye Contact:  Good  Speech:  Clear and Coherent  Volume:  Normal  Mood:  Anxious  Affect:  Appropriate  Thought Process:  Coherent and Descriptions of Associations: Tangential  Orientation:  Full (Time, Place, and Person)  Thought Content:  Delusions  Suicidal Thoughts:  No  Homicidal Thoughts:  No  Memory:  Immediate;   Good Recent;   Good   Judgement:  Fair  Insight:  Fair  Psychomotor Activity:  Normal  Concentration:  Concentration: Good and Attention Span: Good  Recall:  Good  Fund of Knowledge:  Fair  Language:  Fair  Akathisia:  No  Handed:  Right  AIMS (if indicated):  Assets:  Communication Skills Desire for Improvement Financial Resources/Insurance Housing  ADL's:  Intact  Cognition:  WNL  Sleep:        Treatment Plan Summary: Daily contact with patient to assess and evaluate symptoms and progress in treatment and Medication management  Disposition: Recommend psychiatric Inpatient admission when medically cleared.  This service was provided via telemedicine using a 2-way, interactive audio and video technology.  Names of all persons participating in this telemedicine service and their role in this encounter. Name: [name] Role: Patient  Name: Letitia Libra Role: [place], [place] 04/19/2019 2:07 PM

## 2019-04-19 NOTE — ED Notes (Signed)
[facility]ERIFFS TRANSPORTATION REQUESTED TO [place] AM AFTER 9AM.

## 2019-04-19 NOTE — ED Notes (Signed)
Pt alert, calm, cooperative. No s/s of distress. Pt irritable, flat, dull, blunted, guarded.  Pt able to complete ADLs.

## 2019-04-19 NOTE — ED Notes (Signed)
Patient is very flat and non-compliant.  She doesn't understand why her medicine has changed.  She is not eating .  She is just sitting in her bed staring out the door.

## 2019-04-20 NOTE — ED Notes (Signed)
Pt off unit to [facility] per provider. Pt alert, calm, cooperative, no s/s of distress. DC information given at sheriff for [facility]. Belongings given to sheriff for [facility]. Pt ambulatory off the unit, escorted and transported by sheriff.

## 2020-12-24 ENCOUNTER — Encounter (HOSPITAL_COMMUNITY): Payer: Self-pay | Admitting: *Deleted

## 2020-12-24 ENCOUNTER — Emergency Department (HOSPITAL_COMMUNITY): Payer: Medicare Other

## 2020-12-24 ENCOUNTER — Other Ambulatory Visit: Payer: Self-pay

## 2020-12-24 ENCOUNTER — Emergency Department (HOSPITAL_COMMUNITY)
Admission: EM | Admit: 2020-12-24 | Discharge: 2020-12-26 | Disposition: A | Discharge status: Home / Self Care | Payer: Medicare Other | Attending: Emergency Medicine | Admitting: Emergency Medicine

## 2020-12-24 DIAGNOSIS — Y900 Blood alcohol level of less than 20 mg/100 ml: Secondary | ICD-10-CM | POA: Diagnosis not present

## 2020-12-24 DIAGNOSIS — Z20822 Contact with and (suspected) exposure to covid-19: Secondary | ICD-10-CM | POA: Insufficient documentation

## 2020-12-24 DIAGNOSIS — E860 Dehydration: Secondary | ICD-10-CM | POA: Insufficient documentation

## 2020-12-24 DIAGNOSIS — R112 Nausea with vomiting, unspecified: Secondary | ICD-10-CM | POA: Insufficient documentation

## 2020-12-24 DIAGNOSIS — R443 Hallucinations, unspecified: Secondary | ICD-10-CM | POA: Diagnosis present

## 2020-12-24 DIAGNOSIS — F29 Unspecified psychosis not due to a substance or known physiological condition: Secondary | ICD-10-CM | POA: Diagnosis not present

## 2020-12-24 DIAGNOSIS — R109 Unspecified abdominal pain: Secondary | ICD-10-CM | POA: Diagnosis not present

## 2020-12-24 DIAGNOSIS — Z79899 Other long term (current) drug therapy: Secondary | ICD-10-CM | POA: Diagnosis not present

## 2020-12-24 DIAGNOSIS — F23 Brief psychotic disorder: Secondary | ICD-10-CM | POA: Insufficient documentation

## 2020-12-24 LAB — COMPREHENSIVE METABOLIC PANEL
ALT: 84 U/L — ABNORMAL HIGH (ref 0–44)
AST: 41 U/L (ref 15–41)
Albumin: 4.5 g/dL (ref 3.5–5.0)
Alkaline Phosphatase: 101 U/L (ref 38–126)
Anion gap: 17 — ABNORMAL HIGH (ref 5–15)
BUN: 26 mg/dL — ABNORMAL HIGH (ref 6–20)
CO2: 16 mmol/L — ABNORMAL LOW (ref 22–32)
Calcium: 9.6 mg/dL (ref 8.9–10.3)
Chloride: 106 mmol/L (ref 98–111)
Creatinine, Ser: 1 mg/dL (ref 0.44–1.00)
GFR, Estimated: >60 mL/min (ref >60)
Glucose, Bld: 84 mg/dL (ref 70–99)
Potassium: 3.4 mmol/L — ABNORMAL LOW (ref 3.5–5.1)
Sodium: 139 mmol/L (ref 135–145)
Total Bilirubin: 0.9 mg/dL (ref 0.3–1.2)
Total Protein: 7.6 g/dL (ref 6.5–8.1)

## 2020-12-24 LAB — CBC WITH DIFFERENTIAL/PLATELET
Abs Immature Granulocytes: 0.02 10*3/uL (ref 0.00–0.07)
Basophils Absolute: 0.1 10*3/uL (ref 0.0–0.1)
Basophils Relative: 1 %
Eosinophils Absolute: 0.2 10*3/uL (ref 0.0–0.5)
Eosinophils Relative: 3 %
HCT: 51.5 % — ABNORMAL HIGH (ref 36.0–46.0)
Hemoglobin: 17.5 g/dL — ABNORMAL HIGH (ref 12.0–15.0)
Immature Granulocytes: 0 %
Lymphocytes Relative: 29 %
Lymphs Abs: 2.1 10*3/uL (ref 0.7–4.0)
MCH: 28.6 pg (ref 26.0–34.0)
MCHC: 34 g/dL (ref 30.0–36.0)
MCV: 84.3 fL (ref 80.0–100.0)
Monocytes Absolute: 0.5 10*3/uL (ref 0.1–1.0)
Monocytes Relative: 7 %
Neutro Abs: 4.3 10*3/uL (ref 1.7–7.7)
Neutrophils Relative %: 60 %
Platelets: 313 10*3/uL (ref 150–400)
RBC: 6.11 MIL/uL — ABNORMAL HIGH (ref 3.87–5.11)
RDW: 12.3 % (ref 11.5–15.5)
WBC: 7.2 10*3/uL (ref 4.0–10.5)
nRBC: 0 % (ref 0.0–0.2)

## 2020-12-24 LAB — URINALYSIS, ROUTINE W REFLEX MICROSCOPIC
Bilirubin Urine: NEGATIVE
Glucose, UA: NEGATIVE mg/dL
Hgb urine dipstick: NEGATIVE
Ketones, ur: 80 mg/dL — AB
Nitrite: NEGATIVE
Protein, ur: 100 mg/dL — AB
Specific Gravity, Urine: 1.031 — ABNORMAL HIGH (ref 1.005–1.030)
pH: 6 (ref 5.0–8.0)

## 2020-12-24 LAB — RAPID URINE DRUG SCREEN, HOSP PERFORMED
Amphetamines: NOT DETECTED
Barbiturates: NOT DETECTED
Benzodiazepines: NOT DETECTED
Cocaine: NOT DETECTED
Opiates: NOT DETECTED
Tetrahydrocannabinol: NOT DETECTED

## 2020-12-24 LAB — I-STAT VENOUS BLOOD GAS, ED
Acid-base deficit: 5 mmol/L — ABNORMAL HIGH (ref 0.0–2.0)
Bicarbonate: 21.1 mmol/L (ref 20.0–28.0)
Calcium, Ion: 1.19 mmol/L (ref 1.15–1.40)
HCT: 54 % — ABNORMAL HIGH (ref 36.0–46.0)
Hemoglobin: 18.4 g/dL — ABNORMAL HIGH (ref 12.0–15.0)
O2 Saturation: 84 %
Potassium: 5.5 mmol/L — ABNORMAL HIGH (ref 3.5–5.1)
Sodium: 140 mmol/L (ref 135–145)
TCO2: 22 mmol/L (ref 22–32)
pCO2, Ven: 41.5 mmHg — ABNORMAL LOW (ref 44.0–60.0)
pH, Ven: 7.314 (ref 7.250–7.430)
pO2, Ven: 54 mmHg — ABNORMAL HIGH (ref 32.0–45.0)

## 2020-12-24 LAB — I-STAT BETA HCG BLOOD, ED (MC, WL, AP ONLY): I-stat hCG, quantitative: <5 m[IU]/mL (ref <5)

## 2020-12-24 LAB — RESP PANEL BY RT-PCR (FLU A&B, COVID) ARPGX2
Influenza A by PCR: NEGATIVE
Influenza B by PCR: NEGATIVE
SARS Coronavirus 2 by RT PCR: NEGATIVE

## 2020-12-24 LAB — LIPASE, BLOOD: Lipase: 48 U/L (ref 11–51)

## 2020-12-24 LAB — ETHANOL: Alcohol, Ethyl (B): <10 mg/dL (ref <10)

## 2020-12-24 MED ORDER — AMLODIPINE BESYLATE 5 MG PO TABS
5.0000 mg | ORAL_TABLET | Freq: Every day | ORAL | Status: DC
  Administered 2020-12-25 – 2020-12-26 (×2): 5 mg via ORAL
  Filled 2020-12-24 (×2): qty 1

## 2020-12-24 MED ORDER — SODIUM CHLORIDE 0.9 % IV BOLUS
1000.0000 mL | Freq: Once | INTRAVENOUS | Status: AC
  Administered 2020-12-25: 1000 mL via INTRAVENOUS

## 2020-12-24 MED ORDER — IOHEXOL 300 MG/ML  SOLN
100.0000 mL | Freq: Once | INTRAMUSCULAR | Status: AC | PRN
  Administered 2020-12-24: 100 mL via INTRAVENOUS

## 2020-12-24 MED ORDER — ONDANSETRON HCL 4 MG/2ML IJ SOLN
4.0000 mg | Freq: Once | INTRAMUSCULAR | Status: DC
  Filled 2020-12-24: qty 2

## 2020-12-24 NOTE — BH Assessment (Addendum)
Per Lindon Romp, NP pt meets inpatient criteria. Informed charge nurse of pt disposition and charge nurse stated that she will inform EDP.

## 2020-12-24 NOTE — ED Provider Notes (Signed)
Blood pressure (!) 129/91, pulse (!) 107, temperature 99 F (37.2 C), temperature source Oral, resp. rate 14, last menstrual period 04/23/2012, SpO2 99 %.  Assuming care from Dr. Roslynn Amble.  In short, [name] is a 55 y.o. female with a chief complaint of Medical Clearance .  Refer to the original H&P for additional details.  The current plan of care is to follow up on CT abdomen/pelvis. Bicarb is low but pH WNL and patient appearing well. No DKA. CT pending. TTS has seen and recommends inpatient placement.   CT reviewed. No acute findings. Patient is medically clear for TTS disposition. IVF given.     Margette Fast, MD 12/24/20 [phone]

## 2020-12-24 NOTE — ED Notes (Signed)
Called for vitals x2
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
## 2020-12-24 NOTE — ED Provider Notes (Signed)
Emergency Medicine Provider Triage Evaluation Note  [name] , a 55 y.o. female  was evaluated in triage.  Pt complains of hallucinations, delusions, nausea and vomiting.  Patient states over last couple days she has been seeing bugs crawling all over her body, bugs will stay on her abdomen and will watch her and talk to her..  She also notes that she is having some nausea, states every time she brushes her teeth or when she eats anything.  She has no associated stomach pain.  Patient denies suicidal or homicidal ideations, denies recent attempts to herself, denies illicit drug use.  States she has not been taking her medications as prescribed..  Review of Systems  Positive: Hallucinations, delusions Negative: Suicidal or homicidal ideations  Physical Exam  BP (!) 129/91   Pulse (!) 107   Temp 99 F (37.2 C) (Oral)   Resp 14   LMP 04/23/2012   SpO2 99%  Gen:   Awake, no distress   Resp:  Normal effort  MSK:   Moves extremities without difficulty  Other:    Medical Decision Making  Medically screening exam initiated at 3:10 PM.  Appropriate orders placed.  [name] was informed that the remainder of the evaluation will be completed by another provider, this initial triage assessment does not replace that evaluation, and the importance of remaining in the ED until their evaluation is complete  Patient presents with hallucinations and delusions, patient will need further work-up here in the emergency department.     Marcello Fennel, PA-C 12/24/20 1513    Tegeler, Gwenyth Allegra, MD 12/24/20 [phone]

## 2020-12-24 NOTE — ED Triage Notes (Signed)
Pt arrived by [facility] from [facility]. Pt has hx of anxiety, depression and schizophrenia. Pt has been off her meds x 1 month is voluntary today, denies SI or HI but reports auditory hallucinations.

## 2020-12-24 NOTE — ED Provider Notes (Addendum)
[facility] EMERGENCY DEPARTMENT Provider Note   CSN: [id number] Arrival date & time: 12/24/20  1449     History Chief Complaint  Patient presents with   Medical Clearance    [name] is a 55 y.o. female.  Presenting to ER with concern for hallucinations, delusions as well as nausea and vomiting.  Patient reports seeing bugs crawling all over her body.  Does have some nausea, worse with eating, brushing her teeth.  Denies any abdominal pain.  No vomiting currently.  No chest pain or difficulty in breathing.  Denies fever.  Per review of chart has history of schizoaffective disorder.  Patient reports that her only regular medication is amlodipine.  Takes this daily, unsure of her dose.  Also takes Ambien placed weekly.  Mondays and Thursdays.  HPI     Past Medical History:  Diagnosis Date   Back pain    Depression    GERD (gastroesophageal reflux disease)    High cholesterol    Schizoaffective disorder [facility])     Patient Active Problem List   Diagnosis Date Noted   Acute psychosis ([place])    Aggressive behavior 04/03/2019   Anxiety 05/12/2012   Major depression, recurrent ([place]) 05/06/2012    History reviewed. No pertinent surgical history.   OB History   No obstetric history on file.     History reviewed. No pertinent family history.  Social History   Tobacco Use   Smoking status: Never  Substance Use Topics   Alcohol use: Yes    Alcohol/week: 2.0 standard drinks    Types: 2 drink(s) per week   Drug use: No    Home Medications Prior to Admission medications   Medication Sig Start Date End Date Taking? Authorizing Provider  amLODipine (NORVASC) 5 MG tablet Take 5 mg by mouth daily.    [provider]  ARIPiprazole (ABILIFY) 10 MG tablet Take 1 tablet (10 mg total) by mouth daily. For mental clarity. Patient not taking: Reported on 02/23/2019 05/15/12   Nena Polio T, PA-C  atorvastatin (LIPITOR) 20 MG tablet Take 1 tablet  (20 mg total) by mouth at bedtime. For hyperlipidemia. 05/15/12   Ruben Im, PA-C  buPROPion (WELLBUTRIN SR) 200 MG 12 hr tablet Take 1 tablet (200 mg total) by mouth 2 (two) times daily. For depression at 7 AM and 1PM. Patient not taking: Reported on 02/23/2019 05/15/12   Nena Polio T, PA-C  buPROPion (WELLBUTRIN XL) 150 MG 24 hr tablet Take 150 mg by mouth daily.    [provider]  calcium carbonate (CALCIUM 600) 1500 (600 Ca) MG TABS tablet Take 3,000 mg by mouth daily.    [provider]  clonazePAM (KLONOPIN) 1 MG tablet Take 1 tablet (1 mg total) by mouth 2 (two) times daily. For anxiety. Patient taking differently: Take 1 mg by mouth at bedtime.  05/15/12   Ruben Im, PA-C  famciclovir (FAMVIR) 250 MG tablet Take 1 tablet (250 mg total) by mouth at bedtime. For herpes. Patient not taking: Reported on 02/23/2019 05/15/12   Nena Polio T, PA-C  gabapentin (NEURONTIN) 300 MG capsule Take 1 capsule (300 mg total) by mouth at bedtime. For anxiety and neuropathic pain. Patient not taking: Reported on 02/23/2019 05/15/12   Nena Polio T, PA-C  omeprazole (PRILOSEC) 40 MG capsule Take 1 capsule (40 mg total) by mouth daily. For reflux. Patient not taking: Reported on 02/23/2019 05/15/12   Nena Polio T, PA-C  PARoxetine (PAXIL) 30 MG  tablet Take 1 tablet (30 mg total) by mouth at bedtime. For anxiety and depression. Patient not taking: Reported on 02/23/2019 05/15/12   Nena Polio T, PA-C  QUEtiapine (SEROQUEL) 25 MG tablet Take 50-200 mg by mouth See admin instructions. Take 50mg  in am and 75mg  at bedtime. May take an additional 50 MG twice daily if needed for agitation    [provider]  traZODone (DESYREL) 50 MG tablet Take 1 tablet (50 mg total) by mouth at bedtime as needed for sleep (May repeat x 1 if needed). Patient not taking: Reported on 02/23/2019 05/15/12   Nena Polio T, PA-C    Allergies    Luvox [fluvoxamine], Other, and Sulfa  antibiotics  Review of Systems   Review of Systems  Constitutional:  Negative for chills and fever.  HENT:  Negative for ear pain and sore throat.   Eyes:  Negative for pain and visual disturbance.  Respiratory:  Negative for cough and shortness of breath.   Cardiovascular:  Negative for chest pain and palpitations.  Gastrointestinal:  Positive for nausea and vomiting. Negative for abdominal pain.  Genitourinary:  Negative for dysuria and hematuria.  Musculoskeletal:  Negative for arthralgias and back pain.  Skin:  Negative for color change and rash.  Neurological:  Negative for seizures and syncope.  All other systems reviewed and are negative.  Physical Exam Updated Vital Signs BP (!) 129/91   Pulse (!) 107   Temp 99 F (37.2 C) (Oral)   Resp 14   LMP 04/23/2012   SpO2 99%   Physical Exam Vitals and nursing note reviewed.  Constitutional:      General: She is not in acute distress.    Appearance: She is well-developed.  HENT:     Head: Normocephalic and atraumatic.  Eyes:     Conjunctiva/sclera: Conjunctivae normal.  Cardiovascular:     Rate and Rhythm: Normal rate and regular rhythm.     Heart sounds: No murmur heard. Pulmonary:     Effort: Pulmonary effort is normal. No respiratory distress.     Breath sounds: Normal breath sounds.  Abdominal:     Palpations: Abdomen is soft.     Tenderness: There is abdominal tenderness. There is no guarding or rebound.     Comments: Some TTP in epigastrum, otherwise soft  Musculoskeletal:     Cervical back: Neck supple.  Skin:    General: Skin is warm and dry.  Neurological:     General: No focal deficit present.     Mental Status: She is alert.  Psychiatric:     Comments: Flattened affect, no SI or HI    ED Results / Procedures / Treatments   Labs (all labs ordered are listed, but only abnormal results are displayed) Labs Reviewed  COMPREHENSIVE METABOLIC PANEL - Abnormal; Notable for the following components:       Result Value   Potassium 3.4 (*)    CO2 16 (*)    BUN 26 (*)    ALT 84 (*)    Anion gap 17 (*)    All other components within normal limits  CBC WITH DIFFERENTIAL/PLATELET - Abnormal; Notable for the following components:   RBC 6.11 (*)    Hemoglobin 17.5 (*)    HCT 51.5 (*)    All other components within normal limits  RESP PANEL BY RT-PCR (FLU A&B, COVID) ARPGX2  ETHANOL  LIPASE, BLOOD  RAPID URINE DRUG SCREEN, HOSP PERFORMED  URINALYSIS, ROUTINE W REFLEX MICROSCOPIC  I-STAT BETA  HCG BLOOD, ED ([facility], [facility], AP ONLY)  I-STAT VENOUS BLOOD GAS, ED    EKG None  Radiology No results found.  Procedures Procedures   Medications Ordered in ED Medications  amLODipine (NORVASC) tablet 5 mg (has no administration in time range)  sodium chloride 0.9 % bolus 1,000 mL (has no administration in time range)  ondansetron (ZOFRAN) injection 4 mg (has no administration in time range)    ED Course  I have reviewed the triage vital signs and the nursing notes.  Pertinent labs & imaging results that were available during my care of the patient were reviewed by me and considered in my medical decision making (see chart for details).    MDM Rules/Calculators/A&P                          55 year old lady presents to ER with concern for hallucinations.  Has history of schizoaffective disorder.  Has required inpatient psychiatric hospitalization previously.  Patient currently voluntary.  She did endorse having some nausea.  No abdominal pain, abdomen soft with some TTP in epigastrium, no ongoing vomiting in ER.  On review of her basic labs, she was noted to have somewhat low bicarbonate level as well as slight increase in her anion gap.  Suspect degree of dehydration.  LFTs are normal, lipase normal.  Will check CT, VBG, provide fluids and antiemetic and further observe.  If these further studies are wnl, will be cleared to go to in patient psych.   Psychiatry has evaluated patient and they are  recommending inpatient.  Anticipate will hold overnight while awaiting psych placement.  Final Clinical Impression(s) / ED Diagnoses Final diagnoses:  Hallucinations  Dehydration    Rx / DC Orders ED Discharge Orders     None        Lucrezia Starch, MD 12/24/20 2147    Lucrezia Starch, MD 12/24/20 2209

## 2020-12-25 DIAGNOSIS — F29 Unspecified psychosis not due to a substance or known physiological condition: Secondary | ICD-10-CM | POA: Diagnosis not present

## 2020-12-25 MED ORDER — ONDANSETRON 4 MG PO TBDP
ORAL_TABLET | ORAL | Status: AC
  Administered 2020-12-25: 4 mg via ORAL
  Filled 2020-12-25: qty 1

## 2020-12-25 MED ORDER — ONDANSETRON 4 MG PO TBDP: 4.0000 mg | ORAL_TABLET | Freq: Once | ORAL | Status: AC

## 2020-12-25 NOTE — ED Provider Notes (Signed)
Patient has been accepted at [facility], but they are requesting involuntary commitment papers to be filed.  Magistrate has rejected prior involuntary commitment papers, so they were resubmitted.  Patient is demonstrating both visual and auditory hallucinations.  She has visual hallucinations of bugs flying into her eyes, and command hallucinations with voices telling her to stay in certain positions or they will hurt her.  She actually does deny homicidal and suicidal thoughts.   [name], MD [id number] [phone]

## 2020-12-25 NOTE — Progress Notes (Signed)
Patient has been faxed out due to no bed availability at [facility]. Patient meets inpatient criteria per Emerson Monte. Patient referred to the following facilities:  [facility]  [address], [place] [address] [phone] [place]  [address], [place] [address] [phone]  [facility]  [address] [place] [address] [phone] [place]  [address] [place] [address] [phone] [facility]  [address] [place] [address] [phone] [facility]  [place], [place] [address] [phone]  [facility]  [address] [place], [place] [address] [phone]  [place] [place] [address] [phone] [facility]  [address], [place] [address] [phone]  [facility]  [address] [place] [address] [place]  [facility]  [address], [place] [address] [phone]    CSW will continue to monitor disposition.    Mariea Clonts, MSW, LCSW-A  9:49 AM 12/25/2020

## 2020-12-25 NOTE — ED Notes (Signed)
Received verbal report from Sarah W RN at this time 

## 2020-12-25 NOTE — ED Notes (Signed)
Secretary sent IVC paperwork to magistrate to be certified. Secretary called to follow up and the magistrate said to give her some time to send paperwork back.

## 2020-12-25 NOTE — ED Notes (Signed)
Per E-chat: IVC hold paperwork initiated with Network engineer. IVC papers sent to [facility] is concerned that the reported medication non-compliance, hallucinations, delusions, psychosis, and mania are inadequate indications for IVC hold - please advise -  my direct number is [phone] (EDP Dr. Francia Greaves).

## 2020-12-25 NOTE — BH Assessment (Signed)
Disposition:   Received notice from patient's nurse at [facility], [place]) that IVC papers were faxed to patient's accepting hospital [facility]. Clinician contacted the facility and spoke to their staff, "Margreta Journey", who confirmed that the IVC papers were received.

## 2020-12-25 NOTE — ED Notes (Signed)
Pt alert, NAD, calm, interactive, anxious, verbalizes not feeling in control of her body, wants to just lie there stiff and not move. Denies nausea, but mentions vomits medication. Amlodipine and zofran given PO/ODT. Mentions TTS done last night. Does not want to eat breakfast.

## 2020-12-25 NOTE — ED Notes (Addendum)
Patient has been accepted to an inpatient facility however, IVC paperwork required. EDP sent paperwork to magistrate, however the magistrate does not believe the patient meets IVC criteria. Awaiting IVC paperwork certification before patient can be sent to facility. Accepting facility made aware of delay.

## 2020-12-25 NOTE — ED Notes (Signed)
Sitting on side of bed, alert, stiff, calm, NAD, denies needs.

## 2020-12-25 NOTE — BH Assessment (Signed)
Disposition:   Notified patient's nurse at [facility], [place]) that I received a call from "Margreta Journey" with the accepting facility, [facility] [phone]. She was requesting the IVC papers for this patient. Upon chart review it appears patient's IVC was re-submitted waiting to be served by [employer]. Clinician shared that information with patient's nurse. Also, requested nursing to insure that the IVC papers are faxed once they have been served to [phone].

## 2020-12-25 NOTE — ED Notes (Signed)
Pt standing and staring at [facility].

## 2020-12-25 NOTE — ED Notes (Addendum)
Pt ambulated to the RR well  

## 2020-12-25 NOTE — ED Notes (Addendum)
Pt moved from [facility] by w/c to Room 50 Purple. Belongings to [place] [facility], 1 bag. Inventory sheet done.

## 2020-12-25 NOTE — Progress Notes (Signed)
Pt accepted to [facility])     Patient meets inpatient criteria per Lindon Romp, NP.   Bethel Born, NP is the attending provider.    Call report to [place], RN @ [facility] ED notified.     Pt scheduled  to arrive at [facility] today after 1500.    Mariea Clonts, MSW, LCSW-A  1:21 PM 12/25/2020

## 2020-12-25 NOTE — ED Notes (Signed)
Patient shaking in room. This RN entered the room and asked the patient, "Are you okay? Why are you shaking?" The patient replied, "I trying to get the bugs off." The patient denies chronic alcohol use and reports tactile and auditory hallucinations.

## 2020-12-25 NOTE — BH Assessment (Signed)
TTS re-evaluated patient with Darrol Angel, NP. Patient states, "I'm not feeling good. I still feel like I'm being controlled." Patient additionally states she hears voices telling her what to do. She denies SI/HI. Patient expressed reluctance to take medications as she feels they do not help.  Disposition pending

## 2020-12-25 NOTE — ED Notes (Addendum)
Magistrate has yet to send back the patient's certified IVC paperwork. Secretary has attempted to contact magistrate to follow up; no answer. If the magistrate doesn't send certified IVC paperwork by 1900, paperwork will have to be redone.

## 2020-12-26 DIAGNOSIS — F29 Unspecified psychosis not due to a substance or known physiological condition: Secondary | ICD-10-CM | POA: Diagnosis not present

## 2020-12-26 NOTE — ED Notes (Signed)
Pt did not eat any of her lunch. Pt is calm and cooperative, no needs voiced at this time

## 2020-12-26 NOTE — ED Notes (Signed)
Breakfast Ordered 

## 2020-12-26 NOTE — ED Notes (Signed)
Pt did not want to eat any of her breakfast. Pt is calm and cooperative, resting in bed at this time

## 2020-12-26 NOTE — ED Notes (Signed)
Spoke to intake at [facility]. They are expecting pt today. She will contact nursing to have them call this nurse to receive report.

## 2020-12-26 NOTE — ED Notes (Signed)
Officer called to say that she would be here to get the pt in approx 30 minutes

## 2020-12-26 NOTE — ED Notes (Signed)
Received call from [place] saying that she would be leaving in approx 30 minutes to come to transport pt to [facility]. Stated that she would call when she was on her way.

## 2020-12-26 NOTE — Progress Notes (Signed)
[facility] advised that the patient is under review for placement.  [place], MSW, [place], LCAS-A Phone: [phone] Disposition/TOC

## 2020-12-26 NOTE — ED Notes (Signed)
Pt resting calmly in bed. No needs voiced at this time

## 2020-12-26 NOTE — ED Notes (Signed)
[place] again, spoke to [place], to check on status of nursing calling to get report on pt. Altha Harm took down name and contact information and will make sure that it gets to nursing.

## 2021-01-27 ENCOUNTER — Emergency Department (HOSPITAL_COMMUNITY)
Admission: EM | Admit: 2021-01-27 | Discharge: 2021-01-28 | Disposition: A | Discharge status: Home / Self Care | Payer: Medicare Other | Attending: Emergency Medicine | Admitting: Emergency Medicine

## 2021-01-27 DIAGNOSIS — R0602 Shortness of breath: Secondary | ICD-10-CM | POA: Insufficient documentation

## 2021-01-27 DIAGNOSIS — F22 Delusional disorders: Secondary | ICD-10-CM

## 2021-01-27 DIAGNOSIS — F250 Schizoaffective disorder, bipolar type: Secondary | ICD-10-CM | POA: Diagnosis not present

## 2021-01-27 DIAGNOSIS — Z8659 Personal history of other mental and behavioral disorders: Secondary | ICD-10-CM

## 2021-01-27 DIAGNOSIS — Z20822 Contact with and (suspected) exposure to covid-19: Secondary | ICD-10-CM | POA: Insufficient documentation

## 2021-01-27 DIAGNOSIS — R440 Auditory hallucinations: Secondary | ICD-10-CM | POA: Diagnosis present

## 2021-01-27 DIAGNOSIS — O0289 Other abnormal products of conception: Secondary | ICD-10-CM | POA: Diagnosis not present

## 2021-01-27 LAB — COMPREHENSIVE METABOLIC PANEL
ALT: 62 U/L — ABNORMAL HIGH (ref 0–44)
AST: 33 U/L (ref 15–41)
Albumin: 4.2 g/dL (ref 3.5–5.0)
Alkaline Phosphatase: 88 U/L (ref 38–126)
Anion gap: 8 (ref 5–15)
BUN: 15 mg/dL (ref 6–20)
CO2: 26 mmol/L (ref 22–32)
Calcium: 9.5 mg/dL (ref 8.9–10.3)
Chloride: 104 mmol/L (ref 98–111)
Creatinine, Ser: 1.09 mg/dL — ABNORMAL HIGH (ref 0.44–1.00)
GFR, Estimated: >60 mL/min (ref >60)
Glucose, Bld: 88 mg/dL (ref 70–99)
Potassium: 4 mmol/L (ref 3.5–5.1)
Sodium: 138 mmol/L (ref 135–145)
Total Bilirubin: 0.6 mg/dL (ref 0.3–1.2)
Total Protein: 6.9 g/dL (ref 6.5–8.1)

## 2021-01-27 LAB — CBC WITH DIFFERENTIAL/PLATELET
Abs Immature Granulocytes: 0.02 10*3/uL (ref 0.00–0.07)
Basophils Absolute: 0 10*3/uL (ref 0.0–0.1)
Basophils Relative: 1 %
Eosinophils Absolute: 0.1 10*3/uL (ref 0.0–0.5)
Eosinophils Relative: 1 %
HCT: 41.3 % (ref 36.0–46.0)
Hemoglobin: 13.7 g/dL (ref 12.0–15.0)
Immature Granulocytes: 0 %
Lymphocytes Relative: 18 %
Lymphs Abs: 1.3 10*3/uL (ref 0.7–4.0)
MCH: 29.1 pg (ref 26.0–34.0)
MCHC: 33.2 g/dL (ref 30.0–36.0)
MCV: 87.9 fL (ref 80.0–100.0)
Monocytes Absolute: 0.5 10*3/uL (ref 0.1–1.0)
Monocytes Relative: 7 %
Neutro Abs: 5.3 10*3/uL (ref 1.7–7.7)
Neutrophils Relative %: 73 %
Platelets: 255 10*3/uL (ref 150–400)
RBC: 4.7 MIL/uL (ref 3.87–5.11)
RDW: 12.5 % (ref 11.5–15.5)
WBC: 7.2 10*3/uL (ref 4.0–10.5)
nRBC: 0 % (ref 0.0–0.2)

## 2021-01-27 LAB — ETHANOL: Alcohol, Ethyl (B): <10 mg/dL (ref <10)

## 2021-01-27 LAB — RESP PANEL BY RT-PCR (FLU A&B, COVID) ARPGX2
Influenza A by PCR: NEGATIVE
Influenza B by PCR: NEGATIVE
SARS Coronavirus 2 by RT PCR: NEGATIVE

## 2021-01-27 LAB — I-STAT BETA HCG BLOOD, ED (MC, WL, AP ONLY): I-stat hCG, quantitative: <5 m[IU]/mL (ref <5)

## 2021-01-27 NOTE — Discharge Instructions (Addendum)
It was our pleasure to provide your ER care today - we hope that you feel better.  Patient seen and evaluated by psychiatric team and is cleared for outpatient follow up - our behavioral health team recommends close follow up with your doctor/behavioral health provider in the next 1-2 days to reassess your symptoms, and evaluation possible need for medication adjustment.   For mental health systems and/or crisis, you may go the [facility] - it is open 24/7 and walk-ins are welcome.  Return to ER if worse, new symptoms, fevers, trouble breathing, or other emergency concern.

## 2021-01-27 NOTE — ED Notes (Signed)
Pt changed into purple scrubs. Valuables placed with security. Belongings in [place] #3.

## 2021-01-27 NOTE — BHH Counselor (Signed)
This counselor contacted [place] at [phone] to update on disposition. This counselor was transferred to patient's unit but phone rang for several minutes without answer. Will try back shortly.

## 2021-01-27 NOTE — ED Notes (Signed)
PT given pillows and blankets.  Notified of new TTS order.

## 2021-01-27 NOTE — ED Provider Notes (Addendum)
Patient medically cleared. Patient cleared by psychiatric team for discharge. At discharge I contacted the patient's mother and guardian [name] who was infuriated that the patient was not being admitted and insists that she speak with the psychiatric team.  I attempted to contact the assessing provider who was already gone for the day however through back channels I was able to receive a message from her that stated that the patient was not actively suicidal or homicidal and that she could follow-up with her psychiatrist later this week.  I attempted to reiterate this information to the legal guardian however she was livid, threatening lawsuit if anything "happens to my daughter."  And stating that she had "taken care of her for 40 years and I know when she is suicidal."  At this point since the decision making for psychiatric admit admission was not discussed with the legal guardian I have reconsulted with TTS as per the mother's wishes.  I think it would be appropriate for psychiatric team who makes the decisions for or against admission, directly contact the patient's legal guardian prior to either admitting or discharging the patient.      Savannah Mail, PA-C 01/27/21 2105    Savannah Freeze, MD 01/27/21 [phone]

## 2021-01-27 NOTE — BH Assessment (Signed)
[place], RN looking for room for patient to complete TTS

## 2021-01-27 NOTE — ED Triage Notes (Signed)
Pt BIB [facility] from [facility]. Pt experiencing vibrations throughout body 2 weeks ago and is hearing commanding voices telling her when and what to eat. Denies SI/HI.  CAOx4.  CBG-80 BP-104/70 P-92 Spo2-98%  Resp
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
## 2021-01-27 NOTE — ED Provider Notes (Signed)
Emergency Medicine Observation Re-evaluation Note  [name] is a 55 y.o. female, seen on rounds today.  Pt initially presented to the ED for complaints of behavioral health evaluation. Pt resting comfortably but easily aroused. Pt reports that earlier she was experiencing sense of dysesthesia almost if magnetic field was around lower bed/feet area - states that has now resolved. Currently, pt denies any pain, sob, nv, or other physical symptom. Pt has drank fluids/eaten. Pt denies any thoughts of harm to self. Denies feeling depressed.   Physical Exam  BP 130/72 (BP Location: Right Arm)   Pulse 76   Temp 98.5 F (36.9 C) (Oral)   Resp 18   LMP 04/23/2012   SpO2 96%  Physical Exam General: resting, easily aroused, calm/alert, conversant.  Cardiac: regular rate. Lungs: breathing comfortably. Psych: pt exhibits normal mood and affect. Pt does not appear to be responding to internal stimuli. She expresses her thoughts clearly. No hallucinations. No acute psychosis noted. She is alert and oriented. Pt does not appear acutely depressed, and denies feeling depressed. No SI or thoughts of self harm.   ED Course / MDM    I have reviewed the labs performed to date as well as medications administered while in observation.  Recent changes in the last 24 hours include BH reassessment.   Plan  [facility] team has assessed and indicates that patient is psych cleared for d/c back to her facility. (APP had informed guardian of psych clearance and recommendation/plan for d/c.)  Patient currently does appear stable for d/c.   Rec close f/u with her pcp and bh provider.   Return precautions provided.          Lajean Saver, MD 01/27/21 2257

## 2021-01-27 NOTE — ED Notes (Signed)
TTS in progess

## 2021-01-27 NOTE — ED Provider Notes (Signed)
[facility] EMERGENCY DEPARTMENT Provider Note   CSN: [id number] Arrival date & time: 01/27/21  1408     History No chief complaint on file.   [name] is a 55 y.o. female with history of schizoaffective disorder and depression who presents to the emergency department today for further evaluation of "magnetic energy."  She states she has internal auditory commanding hallucinations that tell her to do various tasks and when she does not comply they send this magnetic energy to punish her.  It makes her feel like she is in a whirlpool and causes her body to jar around.  These episodes are intermittent in nature.  She reports associated shortness of breath but denies chest pain, fever, chills, abdominal pain, nausea, vomiting, insomnia, urinary complaints, and bowel complaints.  HPI     Past Medical History:  Diagnosis Date   Back pain    Depression    GERD (gastroesophageal reflux disease)    High cholesterol    Schizoaffective disorder [facility])     Patient Active Problem List   Diagnosis Date Noted   Acute psychosis ([place])    Aggressive behavior 04/03/2019   Anxiety 05/12/2012   Major depression, recurrent ([place]) 05/06/2012    No past surgical history on file.   OB History   No obstetric history on file.     No family history on file.  Social History   Tobacco Use   Smoking status: Never  Substance Use Topics   Alcohol use: Yes    Alcohol/week: 2.0 standard drinks    Types: 2 drink(s) per week   Drug use: No    Home Medications Prior to Admission medications   Medication Sig Start Date End Date Taking? Authorizing Provider  acetaminophen (TYLENOL) 325 MG tablet Take 650 mg by mouth every 8 (eight) hours as needed for headache.    [provider]  amLODipine (NORVASC) 5 MG tablet Take 5 mg by mouth daily.    [provider]  ARIPiprazole (ABILIFY) 5 MG tablet Take 2.5 mg by mouth daily. Patient not taking: Reported on  12/25/2020    [provider]  zolpidem (AMBIEN) 5 MG tablet Take 5 mg by mouth See admin instructions. Take one tablet (5 mg) by mouth on Monday and Thursday nights 11/27/20   [provider]    Allergies    Luvox [fluvoxamine], Other, and Sulfa antibiotics  Review of Systems   Review of Systems  Constitutional:  Negative for chills and fever.  Respiratory:  Positive for shortness of breath.   Cardiovascular:  Negative for chest pain.  Gastrointestinal:  Negative for abdominal pain, constipation, diarrhea, nausea and vomiting.  Genitourinary:  Negative for difficulty urinating, dysuria, frequency and hematuria.  Psychiatric/Behavioral:  Positive for hallucinations. Negative for sleep disturbance and suicidal ideas.    Physical Exam Updated Vital Signs BP 130/72 (BP Location: Right Arm)   Pulse 76   Temp 98.5 F (36.9 C) (Oral)   Resp 18   LMP 04/23/2012   SpO2 96%   Physical Exam Constitutional:      Appearance: Normal appearance.  HENT:     Head: Normocephalic and atraumatic.  Eyes:     General:        Right eye: No discharge.        Left eye: No discharge.  Cardiovascular:     Rate and Rhythm: Normal rate and regular rhythm.     Heart sounds: Normal heart sounds.  Pulmonary:  Effort: Pulmonary effort is normal.     Breath sounds: Normal breath sounds.  Abdominal:     General: Abdomen is flat. Bowel sounds are normal.     Palpations: Abdomen is soft.     Tenderness: There is no abdominal tenderness.  Musculoskeletal:     Cervical back: Neck supple.  Skin:    General: Skin is warm and dry.  Neurological:     Mental Status: She is alert.  Psychiatric:        Attention and Perception: Attention normal. She perceives auditory hallucinations.        Mood and Affect: Mood normal.        Speech: Speech normal.        Behavior: Behavior normal. Behavior is cooperative.    ED Results / Procedures / Treatments   Labs (all labs ordered are listed,  but only abnormal results are displayed) Labs Reviewed  RESP PANEL BY RT-PCR (FLU A&B, COVID) ARPGX2  I-STAT BETA HCG BLOOD, ED ([facility], [facility], [facility] ONLY)    EKG None  Radiology No results found.  Procedures Procedures   Medications Ordered in ED Medications - No data to display  ED Course  I have reviewed the triage vital signs and the nursing notes.  Pertinent labs & imaging results that were available during my care of the patient were reviewed by me and considered in my medical decision making (see chart for details).    MDM Rules/Calculators/A&P                           [name] is a 55 year old female with history of schizoaffective disorder who presents to the emergency department for a psych evaluation.  At this point it is unclear what is going on.  Work-up is still pending.  She is placed in a psychiatric hold for further evaluation pending medical clearance.  Disposition and medical clearance will be made by the oncoming provider.  Care transferred to [facility].  Final Clinical Impression(s) / ED Diagnoses Final diagnoses:  None    Rx / DC Orders ED Discharge Orders     None        Cherrie Gauze 01/27/21 1530    Isla Pence, MD 01/27/21 1536

## 2021-01-28 NOTE — ED Notes (Signed)
[facility] arrived for transport. Pt provided belongings at this time

## 2021-01-28 NOTE — ED Notes (Signed)
[facility] CALLED  °

## 2021-01-28 NOTE — ED Notes (Signed)
[facility] called  

## 2021-01-28 NOTE — ED Notes (Signed)
Received verbal report from Brenda M RN at this time 

## 2021-08-09 ENCOUNTER — Encounter: Payer: Self-pay | Admitting: Gastroenterology

## 2021-08-30 ENCOUNTER — Ambulatory Visit (INDEPENDENT_AMBULATORY_CARE_PROVIDER_SITE_OTHER): Payer: Medicare Other | Admitting: Gastroenterology

## 2021-08-30 ENCOUNTER — Encounter: Payer: Self-pay | Admitting: Gastroenterology

## 2021-08-30 VITALS — BP 136/78 | HR 51 | Ht 60.0 in | Wt 173.6 lb

## 2021-08-30 DIAGNOSIS — R059 Cough, unspecified: Secondary | ICD-10-CM

## 2021-08-30 DIAGNOSIS — R194 Change in bowel habit: Secondary | ICD-10-CM | POA: Diagnosis not present

## 2021-08-30 MED ORDER — NA SULFATE-K SULFATE-MG SULF 17.5-3.13-1.6 GM/177ML PO SOLN: 1.0000 | Freq: Once | ORAL | 0 refills | Status: AC

## 2021-08-30 NOTE — Progress Notes (Addendum)
Referring Provider: Georgann Housekeeper, MD Primary Care Physician:  Marinda Elk, MD  Reason for Consultation:  Concerns for reflux and food intolerances   IMPRESSION:  Chronic cough with intermittent regurgitation and vomiting. May be GERD. Differential also includes rumination and esophageal dysmotility. EGD recommended to start the evaluation.   Lactose intolerance with possibility for concurrent food intolerances. I have made it clear that I have limits for diagnosis and testing food intolerances. If this remains her primary concern, I think she would be best helped at an academic center. We also discussed consultation with an allergist. Will plan duodenal biopsies at time of EGD and colonoscopy with random biopsies to evaluate for organic GI disease.  No prior colon cancer screening. Resistant to colonoscopy due to bowel prep, but, she is willing to consider.   Challening situation because she has had over 10 years of symptoms. She is seen today without access to any of her prior medical records.    PLAN: EGD with biopsies Colonoscopy for colon cancer screening I offered consultation with an allergist Encouraged her to seek consultation at an academic center   HPI: [name] is a 56 y.o. female referred by Dr. Deborha Payment for chronic cough and reflux. She comes from a nursing home without any prior medical records except for the face sheet documenting diagnoses of auditory hallucinations and hyperlipidemia.  The history is obtained strictly through the patient except for the facesheet from.  She brings 4 pages of handwritten records regarding her symptoms. These have been submitted to be scanned in her medical record.  Her primary reasons for this visit include coughing, vomiting without nausea, loose stools, fecal soiling, diarrhea, and chest pain.  She has a history of lactose intolerance, chronic headaches, depression, hypercholesterolemia, hypertension and schizoaffective  disorder.  She had PEG 16 years ago, but she doesn't know why.  She has been in assisted living for 15 years. She has a guardian.   Saw a gastroenterologist in her early 32s for abdominal pain. She had a colonoscopy in her 30s or mid 40 to evaluate constipation that in retrospect she attributes to long-term use of Resperidol. During that time her stools were very wide caliber and uncomfortable. She has treated with Milk of Magnesia after lack of response to Miralax.    Over the last 5 years she had had lactose intolerance with diarrhea, gas and fecal soiling occurring with any dairy exposure or medications that have lactose in their ingredients.  Stool consistency ranges from soft and mushy to watery diarrhea.  She has 1-3 bowel movements daily followed by the passage of multiple mucousy stools.  Switching to dairy free and using coconut milk improved her symptoms until recently.   Now she finds that coconut milk also causes her symptoms. No blood in the stools.  She is bothered by seeing undigested food in her stool when she feels she was able to digest these in the past.  Following an elimination diet for 3 years she has also been able to identify several other foods that she feels may be food allergies or intolerances in addition to dairy including whey, orange soda, lactose-free milk with lactase, Kason, coconut oil, corn syrup solids, nondairy creamer, hazelnut with results of loose stools, gas, coughing, and sensation of a throat/chest stricture.  She notes being able to eat milk and cereal as long as she does not drink it, dry coconut baked into cookies, peanuts and peanut butter, most snack sized candy bars including those with  chocolate, and chocolate cake with sugar icing.  Several foods have caused a rash or blisters including fresh pineapple, grapefruit, orange juice, raw green peppers, vinegar such as salt and vinegar potato chips, ascorbic acid, citric acid, excessive coffee, fresh lemon and  lime.  She has had similar reactions to sulfa drugs, valproic acid including Depakote, and multivitamin with vitamin C.  She has used Famvir and Zovirax for the blisters but feels that the lactose in those medications causes the associated severe diarrhea.  She has a chronic cough and intermittent vomiting and regurgitation.  She worries the constant coughing has caused a stricture in her throat and chest due to intermittent wheezing that has occurred 4 times.  She denies reflux or heartburn symptoms. The new doctor at her assisted living facility thinks it's GERD but she hasn't agreed with this because she doesn't have acid symptoms.  No improvement with Protonix 40 mg taken appropriately every morning. Did not tolerate omeprazole given her lactose intolerance. No odynophagia, neck pain, sore throat, dysphonia.  However she sometimes feels like water struggles to pass bile food bolus.  Additional concerns include a sensitive gag reflex, watery nose while eating, postnasal drip after eating, frequent earache, sinus "swelling" at night where she wakes up with difficulty breathing.  Last colonoscopy 10 years. She is not interested in any colon cancer screening, colon cancer prevention, or colonoscopy. She even has reservations about an upper endoscopy.    There is no known family history of colon cancer or polyps. No family history of stomach cancer or other GI malignancy. No family history of inflammatory bowel disease or celiac.   Labs from 08/09/2021 showing normal comprehensive metabolic panel with a hemoglobin of 12.8, MCV 90.2, RDW 12.4, platelets 201.  She also has a normal hepatic function panel.   Past Medical History:  Diagnosis Date   Back pain    Depression    GERD (gastroesophageal reflux disease)    High cholesterol    Schizoaffective disorder (H[facility])     Past Surgical History:  Procedure Laterality Date   GASTROSTOMY W/ FEEDING TUBE     16 yrs ago   MM BREAST STEREO BIOPSY LEFT  ([facility] HX)     benign fibroid   TONSILLECTOMY AND ADENOIDECTOMY     as a child    Prior to Admission medications   Medication Sig Start Date End Date Taking? Authorizing Provider  acetaminophen (TYLENOL) 325 MG tablet Take 650 mg by mouth every 4 (four) hours as needed for headache, mild pain or fever.   Yes [provider]  amLODipine (NORVASC) 5 MG tablet Take 5 mg by mouth daily.   Yes [provider]  atorvastatin (LIPITOR) 10 MG tablet Take 20 mg by mouth at bedtime.   Yes [provider]  hydrochlorothiazide (MICROZIDE) 12.5 MG capsule Take 12.5 mg by mouth every morning.   Yes [provider]  lamoTRIgine (LAMICTAL) 25 MG tablet Take 50 mg by mouth daily.   Yes [provider]  OYSCO 500 + D 500-200 MG-UNIT TABS Take 1 tablet by mouth 2 (two) times daily.   Yes [provider]  pantoprazole (PROTONIX) 40 MG tablet Take 40 mg by mouth daily.   Yes [provider]  propranolol (INDERAL) 40 MG tablet Take 40 mg by mouth daily.   Yes [provider]  traZODone (DESYREL) 50 MG tablet Take 200 mg by mouth at bedtime.   Yes [provider]    Current Outpatient Medications  Medication Sig Dispense Refill   acetaminophen (TYLENOL) 325 MG tablet Take 650 mg by mouth every 4 (four) hours as needed for headache, mild pain or fever.     amLODipine (NORVASC) 5 MG tablet Take 5 mg by mouth daily.     atorvastatin (LIPITOR) 10 MG tablet Take 20 mg by mouth at bedtime.     hydrochlorothiazide (MICROZIDE) 12.5 MG capsule Take 12.5 mg by mouth every morning.     lamoTRIgine (LAMICTAL) 25 MG tablet Take 50 mg by mouth daily.     OYSCO 500 + D 500-200 MG-UNIT TABS Take 1 tablet by mouth 2 (two) times daily.     pantoprazole (PROTONIX) 40 MG tablet Take 40 mg by mouth daily.     propranolol (INDERAL) 40 MG tablet Take 40 mg by mouth daily.     traZODone (DESYREL) 50 MG tablet Take 200 mg by mouth at bedtime.     No  current facility-administered medications for this visit.    Allergies as of 08/30/2021 - Review Complete 08/30/2021  Allergen Reaction Noted   Ascorbic acid & derivatives Other (See Comments) 01/27/2021   Citrus Other (See Comments) 01/27/2021   Luvox [fluvoxamine] Other (See Comments) 02/23/2019   Other Other (See Comments) 02/23/2019   Sulfa antibiotics Other (See Comments) 05/06/2012   Sulfate  08/30/2021    Family History  Problem Relation Age of Onset   Esophageal cancer Neg Hx    Pancreatic cancer Neg Hx    Liver disease Neg Hx    Rectal cancer Neg Hx    Colon cancer Neg Hx     Social History   Socioeconomic History   Marital status: Divorced    Spouse name: Not on file   Number of children: Not on file   Years of education: Not on file   Highest education level: Not on file  Occupational History   Not on file  Tobacco Use   Smoking status: Never    Passive exposure: Never   Smokeless tobacco: Never  Vaping Use   Vaping Use: Never used  Substance and Sexual Activity   Alcohol use: Not Currently    Comment: occ   Drug use: No   Sexual activity: Not Currently    Birth control/protection: None  Other Topics Concern   Not on file  Social History Narrative   Not on file   Social Determinants of Health   Financial Resource Strain: Not on file  Food Insecurity: Not on file  Transportation Needs: Not on file  Physical Activity: Not on file  Stress: Not on file  Social Connections: Not on file  Intimate Partner Violence: Not on file    Review of Systems: 12 system ROS is negative except as noted above with the addition of allergies, sinus troubles, cough, headaches, nocturnal shortness of breath and dyspnea on exertion, insomnia, sore throat, excessive coughing and clearing, prior excessive urination attributed to diuretics, and urinary leakage with coughing and sneezing.Marland Kitchen   Physical Exam: General:   Alert,  well-nourished, pleasant and cooperative in  NAD Head:  Normocephalic and atraumatic. Eyes:  Sclera clear, no icterus.   Conjunctiva pink. Ears:  Normal auditory acuity. Nose:  No deformity, discharge,  or lesions. Mouth:  No deformity or lesions.   Neck:  Supple; no masses or thyromegaly. Lungs:  Clear throughout to auscultation.   No wheezes. Heart:  Regular rate and rhythm; no murmurs. Abdomen:  Soft, nontender, nondistended, normal bowel sounds, no rebound or guarding. No hepatosplenomegaly.  Rectal:  Deferred  Msk:  Symmetrical. No boney deformities LAD: No inguinal or umbilical LAD Extremities:  No clubbing or edema. Neurologic:  Alert and  oriented x4;  grossly nonfocal Skin:  Intact without significant lesions or rashes. Psych:  Alert and cooperative. Normal mood and affect.    [name] L. Orvan Falconer, MD, MPH 08/30/2021, 11:02 AM

## 2021-08-30 NOTE — Patient Instructions (Addendum)
It was my pleasure to provide care to you today. Based on our discussion, I am providing you with my recommendations below: ? ?RECOMMENDATION(S):  ? ?COLONOSCOPY/UPPER ENDOSCOPY:  ? ?You have been scheduled for a colonoscopy/upper endoscopy. Please follow written instructions given to you at your visit today.  ? ?PREP:  ? ?Please pick up your prep supplies at the pharmacy within the next 1-3 days. ? ?INHALERS:  ? ?If you use inhalers (even only as needed), please bring them with you on the day of your procedure. ? ?COLONOSCOPY TIPS: ? ?To reduce nausea and dehydration, stay well hydrated for 3-4 days prior to the exam.  ?To prevent skin/hemorrhoid irritation - prior to wiping, put A&Dointment or vaseline on the toilet paper. ?Keep a towel or pad on the bed.  ?BEFORE STARTING YOUR PREP, drink  64oz of clear liquids in the morning. This will help to flush the colon and will ensure you are well hydrated!!!!  ?NOTE - This is in addition to the fluids required for to complete your prep. ?Use of a flavored hard candy, such as grape Anise Salvo, can counteract some of the flavor of the prep and may prevent some nausea.  ? ? ?FOLLOW UP: ? ?After your procedure, you will receive a call from my office staff regarding my recommendation for follow up. ? ?BMI: ? ?If you are age 65 or older, your body mass index should be between 23-30. Your Body mass index is 33.9 kg/m?Marland Kitchen If this is out of the aforementioned range listed, please consider follow up with your Primary Care Provider. ? ?If you are age 54 or younger, your body mass index should be between 19-25. Your Body mass index is 33.9 kg/m?Marland Kitchen If this is out of the aformentioned range listed, please consider follow up with your Primary Care Provider.  ? ?MY CHART: ? ?The [facility] providers would like to encourage you to use [facility] to communicate with providers for non-urgent requests or questions.  Due to long hold times on the telephone, sending your provider a message by  [facility] may be a faster and more efficient way to get a response.  Please allow 48 business hours for a response.  Please remember that this is for non-urgent requests.  ? ?Thank you for trusting me with your gastrointestinal care!   ? ?Thornton Park, MD, MPH ? ?

## 2021-10-11 ENCOUNTER — Telehealth: Payer: Self-pay

## 2021-10-11 ENCOUNTER — Ambulatory Visit: Payer: Medicare Other | Admitting: Gastroenterology

## 2021-10-11 ENCOUNTER — Encounter: Payer: Self-pay | Admitting: Gastroenterology

## 2021-10-11 VITALS — BP 103/72 | HR 56 | Temp 97.8°F | Resp 11 | Ht 60.0 in | Wt 173.0 lb

## 2021-10-11 DIAGNOSIS — D120 Benign neoplasm of cecum: Secondary | ICD-10-CM | POA: Diagnosis not present

## 2021-10-11 DIAGNOSIS — K5140 Inflammatory polyps of colon without complications: Secondary | ICD-10-CM | POA: Diagnosis not present

## 2021-10-11 DIAGNOSIS — R194 Change in bowel habit: Secondary | ICD-10-CM

## 2021-10-11 DIAGNOSIS — D123 Benign neoplasm of transverse colon: Secondary | ICD-10-CM

## 2021-10-11 DIAGNOSIS — R059 Cough, unspecified: Secondary | ICD-10-CM

## 2021-10-11 DIAGNOSIS — K209 Esophagitis, unspecified without bleeding: Secondary | ICD-10-CM

## 2021-10-11 MED ORDER — PANTOPRAZOLE SODIUM 40 MG PO TBEC: 40.0000 mg | DELAYED_RELEASE_TABLET | Freq: Two times a day (BID) | ORAL | 3 refills | Status: AC

## 2021-10-11 MED ORDER — SODIUM CHLORIDE 0.9 % IV SOLN
500.0000 mL | Freq: Once | INTRAVENOUS | Status: DC
Start: 2021-10-11 — End: 2021-10-11

## 2021-10-11 NOTE — Telephone Encounter (Signed)
-----   Message from Thornton Park, MD sent at 10/11/2021  3:00 PM EDT ----- ?Office follow-up with me or Amy in 6-8 weeks. Thanks. ? ?KLB ? ?

## 2021-10-11 NOTE — Patient Instructions (Signed)
Information on polyps and esophagitis given to you today. ? ?Await pathology results. ? ? ?Avoid NSAIDS (Aspirin, Ibuprofen, Aleve, Naproxen), you may use Tylenol as needed. ? ?Increase pantoprazole to 40 mg twice a day. ? ?Resume previous diet and medications. ? ? ?YOU HAD AN ENDOSCOPIC PROCEDURE TODAY AT [facility]:   Refer to the procedure report that was given to you for any specific questions about what was found during the examination.  If the procedure report does not answer your questions, please call your gastroenterologist to clarify.  If you requested that your care partner not be given the details of your procedure findings, then the procedure report has been included in a sealed envelope for you to review at your convenience later. ? ?YOU SHOULD EXPECT: Some feelings of bloating in the abdomen. Passage of more gas than usual.  Walking can help get rid of the air that was put into your GI tract during the procedure and reduce the bloating. If you had a lower endoscopy (such as a colonoscopy or flexible sigmoidoscopy) you may notice spotting of blood in your stool or on the toilet paper. If you underwent a bowel prep for your procedure, you may not have a normal bowel movement for a few days. ? ?Please Note:  You might notice some irritation and congestion in your nose or some drainage.  This is from the oxygen used during your procedure.  There is no need for concern and it should clear up in a day or so. ? ?SYMPTOMS TO REPORT IMMEDIATELY: ? ?Following lower endoscopy (colonoscopy or flexible sigmoidoscopy): ? Excessive amounts of blood in the stool ? Significant tenderness or worsening of abdominal pains ? Swelling of the abdomen that is new, acute ? Fever of 100?F or higher ? ?Following upper endoscopy (EGD) ? Vomiting of blood or coffee ground material ? New chest pain or pain under the shoulder blades ? Painful or persistently difficult swallowing ? New shortness of breath ? Fever  of 100?F or higher ? Black, tarry-looking stools ? ?For urgent or emergent issues, a gastroenterologist can be reached at any hour by calling [phone]. ?Do not use MyChart messaging for urgent concerns.  ? ? ?DIET:  We do recommend a small meal at first, but then you may proceed to your regular diet.  Drink plenty of fluids but you should avoid alcoholic beverages for 24 hours. ? ?ACTIVITY:  You should plan to take it easy for the rest of today and you should NOT DRIVE or use heavy machinery until tomorrow (because of the sedation medicines used during the test).   ? ?FOLLOW UP: ?Our staff will call the number listed on your records 48-72 hours following your procedure to check on you and address any questions or concerns that you may have regarding the information given to you following your procedure. If we do not reach you, we will leave a message.  We will attempt to reach you two times.  During this call, we will ask if you have developed any symptoms of COVID 19. If you develop any symptoms (ie: fever, flu-like symptoms, shortness of breath, cough etc.) before then, please call [phone].  If you test positive for Covid 19 in the 2 weeks post procedure, please call and report this information to [place].   ? ?If any biopsies were taken you will be contacted by phone or by letter within the next 1-3 weeks.  Please call us at [phone] if you have  not heard about the biopsies in 3 weeks.  ? ? ?SIGNATURES/CONFIDENTIALITY: ?You and/or your care partner have signed paperwork which will be entered into your electronic medical record.  These signatures attest to the fact that that the information above on your After Visit Summary has been reviewed and is understood.  Full responsibility of the confidentiality of this discharge information lies with you and/or your care-partner.  ?

## 2021-10-11 NOTE — Op Note (Signed)
[place] ?Patient Name: [name] ?Procedure Date: 10/11/2021 2:08 PM ?MRN: [id number] ?Endoscopist: Thornton Park MD, MD ?Age: 56 ?Referring MD:  ?Date of Birth: [birth year] ?Gender: Female ?Account #: [id number] ?Procedure:                Upper GI endoscopy ?Indications:              Chronic cough ?Medicines:                Monitored Anesthesia Care ?Procedure:                Pre-Anesthesia Assessment: ?                          - Prior to the procedure, a History and Physical  ?                          was performed, and patient medications and  ?                          allergies were reviewed. The patient's tolerance of  ?                          previous anesthesia was also reviewed. The risks  ?                          and benefits of the procedure and the sedation  ?                          options and risks were discussed with the patient.  ?                          All questions were answered, and informed consent  ?                          was obtained. Prior Anticoagulants: The patient has  ?                          taken no previous anticoagulant or antiplatelet  ?                          agents. ASA Grade Assessment: II - A patient with  ?                          mild systemic disease. After reviewing the risks  ?                          and benefits, the patient was deemed in  ?                          satisfactory condition to undergo the procedure. ?                          After obtaining informed consent, the endoscope was  ?  passed under direct vision. Throughout the  ?                          procedure, the patient's blood pressure, pulse, and  ?                          oxygen saturations were monitored continuously. The  ?                          Endoscope was introduced through the mouth, and  ?                          advanced to the third part of duodenum. The upper  ?                          GI endoscopy was accomplished without  difficulty.  ?                          The patient tolerated the procedure well. ?Scope In: ?Scope Out: ?Findings:                 LA Grade A (one or more mucosal breaks less than 5  ?                          mm, not extending between tops of 2 mucosal folds)  ?                          esophagitis with no bleeding was found 32 cm from  ?                          the incisors. Biopsies were taken with a cold  ?                          forceps for histology. Estimated blood loss was  ?                          minimal. ?                          The entire examined stomach was normal. Biopsies  ?                          were taken from the antrum, body, and fundus with a  ?                          cold forceps for histology. Estimated blood loss  ?                          was minimal. ?                          The examined duodenum was normal. ?  The cardia and gastric fundus were normal on  ?                          retroflexion. ?                          The exam was otherwise without abnormality. ?Complications:            No immediate complications. ?Estimated Blood Loss:     Estimated blood loss was minimal. ?Impression:               - LA Grade A reflux esophagitis with no bleeding.  ?                          Biopsied. ?                          - Normal stomach. Biopsied. ?                          - Normal examined duodenum. ?                          - The examination was otherwise normal. ?Recommendation:           - Patient has a contact number available for  ?                          emergencies. The signs and symptoms of potential  ?                          delayed complications were discussed with the  ?                          patient. Return to normal activities tomorrow.  ?                          Written discharge instructions were provided to the  ?                          patient. ?                          - Resume previous diet. ?                          -  Continue present medications. ?                          - Increase pantoprazole to 40 mg twice daily. ?                          - Await pathology results. ?                          - No aspirin, ibuprofen, naproxen, or other  ?  non-steroidal anti-inflammatory drugs. ?Thornton Park MD, MD ?10/11/2021 2:40:39 PM ?This report has been signed electronically. ?

## 2021-10-11 NOTE — Progress Notes (Signed)
? ?Referring Provider: Elmore Guise, MD ?Primary Care Physician:  Elmore Guise, MD ? ?Indication for Upper endoscopy:  Cough ?Indication for Colonoscopy:  Colon cancer screening ? ? ?IMPRESSION:  ?EGD to evaluate cough ?Need for colon cancer screening ?Appropriate candidate for monitored anesthesia care ? ?PLAN: ?EGD and Colonoscopy in the [facility] today ? ? ?HPI: [name] is a 56 y.o. female presents for endoscopic evaluation. ? ?She has a chronic cough and intermittent vomiting and regurgitation.  She worries the constant coughing has caused a stricture in her throat and chest due to intermittent wheezing that has occurred 4 times.  She denies reflux or heartburn symptoms. The new doctor at her assisted living facility thinks it's GERD but she hasn't agreed with this because she doesn't have acid symptoms.  No improvement with Protonix 40 mg taken appropriately every morning. Did not tolerate omeprazole given her lactose intolerance. No odynophagia, neck pain, sore throat, dysphonia.  However she sometimes feels like water struggles to pass bile food bolus. ?  ?Additional concerns include a sensitive gag reflex, watery nose while eating, postnasal drip after eating, frequent earache, sinus "swelling" at night where she wakes up with difficulty breathing. ?  ?Last colonoscopy 10 years. She is not interested in any colon cancer screening, colon cancer prevention, or colonoscopy. She even has reservations about an upper endoscopy.   ?  ?There is no known family history of colon cancer or polyps. No family history of stomach cancer or other GI malignancy. No family history of inflammatory bowel disease or celiac.  ? ? ?Past Medical History:  ?Diagnosis Date  ? Back pain   ? Depression   ? GERD (gastroesophageal reflux disease)   ? High cholesterol   ? Schizoaffective disorder ([place])   ? ? ?Past Surgical History:  ?Procedure Laterality Date  ? GASTROSTOMY W/ FEEDING TUBE    ? 16 yrs ago  ? MM BREAST STEREO  BIOPSY LEFT ([place] HX)    ? benign fibroid  ? TONSILLECTOMY AND ADENOIDECTOMY    ? as a child  ? ? ?Current Outpatient Medications  ?Medication Sig Dispense Refill  ? acetaminophen (TYLENOL) 325 MG tablet Take 650 mg by mouth every 4 (four) hours as needed for headache, mild pain or fever.    ? amLODipine (NORVASC) 5 MG tablet Take 5 mg by mouth daily.    ? atorvastatin (LIPITOR) 10 MG tablet Take 20 mg by mouth at bedtime.    ? hydrochlorothiazide (MICROZIDE) 12.5 MG capsule Take 12.5 mg by mouth every morning.    ? lamoTRIgine (LAMICTAL) 25 MG tablet Take 50 mg by mouth daily.    ? OYSCO 500 + D 500-200 MG-UNIT TABS Take 1 tablet by mouth 2 (two) times daily.    ? pantoprazole (PROTONIX) 40 MG tablet Take 40 mg by mouth daily.    ? propranolol (INDERAL) 40 MG tablet Take 40 mg by mouth daily.    ? traZODone (DESYREL) 50 MG tablet Take 200 mg by mouth at bedtime.    ? ?Current Facility-Administered Medications  ?Medication Dose Route Frequency Provider Last Rate Last Admin  ? 0.9 %  sodium chloride infusion  500 mL Intravenous Once Thornton Park, MD      ? ? ?Allergies as of 10/11/2021 - Review Complete 10/11/2021  ?Allergen Reaction Noted  ? Ascorbic acid & derivatives Other (See Comments) 01/27/2021  ? Citrus Other (See Comments) 01/27/2021  ? Luvox [fluvoxamine] Other (See Comments) 02/23/2019  ? Other Other (See Comments)  02/23/2019  ? Sulfa antibiotics Other (See Comments) 05/06/2012  ? Sulfate  08/30/2021  ? ? ?Family History  ?Problem Relation Age of Onset  ? Esophageal cancer Neg Hx   ? Pancreatic cancer Neg Hx   ? Liver disease Neg Hx   ? Rectal cancer Neg Hx   ? Colon cancer Neg Hx   ? ? ? ?Physical Exam: ?General:   Alert,  well-nourished, pleasant and cooperative in NAD ?Head:  Normocephalic and atraumatic. ?Eyes:  Sclera clear, no icterus.   Conjunctiva pink. ?Mouth:  No deformity or lesions.   ?Neck:  Supple; no masses or thyromegaly. ?Lungs:  Clear throughout to auscultation.   No wheezes. ?Heart:   Regular rate and rhythm; no murmurs. ?Abdomen:  Soft, non-tender, nondistended, normal bowel sounds, no rebound or guarding.  ?Msk:  Symmetrical. No boney deformities ?LAD: No inguinal or umbilical LAD ?Extremities:  No clubbing or edema. ?Neurologic:  Alert and  oriented x4;  grossly nonfocal ?Skin:  No obvious rash or bruise. ?Psych:  Alert and cooperative. Normal mood and affect. ? ? ? ? ?Studies/Results: ?No results found. ? ? ? ?Drevin Ortner L. Tarri Glenn, MD, MPH ?10/11/2021, 1:26 PM ? ? ? ?  ?

## 2021-10-11 NOTE — Progress Notes (Signed)
To pacu, VSS. Report to [email or web address] 

## 2021-10-11 NOTE — Op Note (Signed)
[place] [place] ?Patient Name: [name] ?Procedure Date: 10/11/2021 2:08 PM ?MRN: [id number] ?Endoscopist: Thornton Park MD, MD ?Age: 56 ?Referring MD:  ?Date of Birth: 03/06/1966 ?Gender: Female ?Account #: [id number] ?Procedure:                Colonoscopy ?Indications:              Screening for colorectal malignant neoplasm ?                          No known family history of colon cancer or polyps ?Medicines:                Monitored Anesthesia Care ?Procedure:                Pre-Anesthesia Assessment: ?                          - Prior to the procedure, a History and Physical  ?                          was performed, and patient medications and  ?                          allergies were reviewed. The patient's tolerance of  ?                          previous anesthesia was also reviewed. The risks  ?                          and benefits of the procedure and the sedation  ?                          options and risks were discussed with the patient.  ?                          All questions were answered, and informed consent  ?                          was obtained. Prior Anticoagulants: The patient has  ?                          taken no previous anticoagulant or antiplatelet  ?                          agents. ASA Grade Assessment: II - A patient with  ?                          mild systemic disease. After reviewing the risks  ?                          and benefits, the patient was deemed in  ?                          satisfactory condition to undergo the procedure. ?  After obtaining informed consent, the colonoscope  ?                          was passed under direct vision. Throughout the  ?                          procedure, the patient's blood pressure, pulse, and  ?                          oxygen saturations were monitored continuously. The  ?                          Olympus CF-[id number] (#[id number]) Colonoscope was  ?                          introduced through the  anus and advanced to the 3  ?                          cm into the ileum. A second forward view of the  ?                          right colon was performed. The colonoscopy was  ?                          performed without difficulty. The patient tolerated  ?                          the procedure well. The quality of the bowel  ?                          preparation was good. The terminal ileum, ileocecal  ?                          valve, appendiceal orifice, and rectum were  ?                          photographed. ?Scope In: 2:23:14 PM ?Scope Out: 2:36:06 PM ?Scope Withdrawal Time: 0 hours 9 minutes 4 seconds  ?Total Procedure Duration: 0 hours 12 minutes 52 seconds  ?Findings:                 The perianal and digital rectal examinations were  ?                          normal. ?                          A 12 mm polyp was found in the transverse colon.  ?                          The polyp was sessile. The polyp was removed with a  ?                          piecemeal technique using a cold snare. Resection  ?  and retrieval were complete. Estimated blood loss  ?                          was minimal. ?                          A 3 mm polyp was found in the cecum. The polyp was  ?                          sessile. The polyp was removed with a cold snare.  ?                          Resection and retrieval were complete. Estimated  ?                          blood loss was minimal. ?                          The exam was otherwise without abnormality on  ?                          direct and retroflexion views. ?Complications:            No immediate complications. ?Estimated Blood Loss:     Estimated blood loss was minimal. ?Impression:               - One 12 mm polyp in the transverse colon, removed  ?                          piecemeal using a cold snare. Resected and  ?                          retrieved. ?                          - One 3 mm polyp in the cecum, removed with a cold  ?                           snare. Resected and retrieved. ?                          - The examination was otherwise normal on direct  ?                          and retroflexion views. ?Recommendation:           - Patient has a contact number available for  ?                          emergencies. The signs and symptoms of potential  ?                          delayed complications were discussed with the  ?                          patient.  Return to normal activities tomorrow.  ?                          Written discharge instructions were provided to the  ?                          patient. ?                          - Resume previous diet. ?                          - Continue present medications. ?                          - Await pathology results. ?                          - Repeat colonoscopy date to be determined after  ?                          pending pathology results are reviewed for  ?                          surveillance. ?                          - Emerging evidence supports eating a diet of  ?                          fruits, vegetables, grains, calcium, and yogurt  ?                          while reducing red meat and alcohol may reduce the  ?                          risk of colon cancer. ?Thornton Park MD, MD ?10/11/2021 2:48:50 PM ?This report has been signed electronically. ?

## 2021-10-11 NOTE — Telephone Encounter (Signed)
Per Dr. Tarri Glenn request, pt has been scheduled for 6-8 wk f/u on 11/24/21 @ 1110am. Appt reminder has been mailed.  ?

## 2021-10-11 NOTE — Progress Notes (Signed)
Called to room to assist during endoscopic procedure.  Patient ID and intended procedure confirmed with present staff. Received instructions for my participation in the procedure from the performing physician.  

## 2021-10-13 ENCOUNTER — Telehealth: Payer: Self-pay

## 2021-10-13 ENCOUNTER — Telehealth: Payer: Self-pay | Admitting: *Deleted

## 2021-10-13 NOTE — Telephone Encounter (Signed)
?  Follow up Call- ? ? ?  10/11/2021  ?  1:20 PM  ?Call back number  ?Post procedure Call Back phone  # [phone]- pt cell also call facility please--[phone], [place]  ?Permission to leave phone message Yes  ? LMOM to call back with any questions or concerns.   ?

## 2021-10-13 NOTE — Telephone Encounter (Signed)
First post procedure follow up call, no answer 

## 2021-10-15 ENCOUNTER — Other Ambulatory Visit: Payer: Self-pay

## 2021-10-15 ENCOUNTER — Ambulatory Visit (HOSPITAL_COMMUNITY)
Admission: EM | Admit: 2021-10-15 | Discharge: 2021-10-19 | Disposition: A | Discharge status: Skilled Nursing Facility | Payer: Medicare Other | Attending: Psychiatry | Admitting: Psychiatry

## 2021-10-15 DIAGNOSIS — Z20822 Contact with and (suspected) exposure to covid-19: Secondary | ICD-10-CM | POA: Insufficient documentation

## 2021-10-15 DIAGNOSIS — Z79899 Other long term (current) drug therapy: Secondary | ICD-10-CM | POA: Insufficient documentation

## 2021-10-15 DIAGNOSIS — F250 Schizoaffective disorder, bipolar type: Secondary | ICD-10-CM | POA: Diagnosis not present

## 2021-10-15 DIAGNOSIS — E7800 Pure hypercholesterolemia, unspecified: Secondary | ICD-10-CM | POA: Insufficient documentation

## 2021-10-15 DIAGNOSIS — E876 Hypokalemia: Secondary | ICD-10-CM | POA: Insufficient documentation

## 2021-10-15 LAB — CBC WITH DIFFERENTIAL/PLATELET
Abs Immature Granulocytes: 0.02 10*3/uL (ref 0.00–0.07)
Basophils Absolute: 0 10*3/uL (ref 0.0–0.1)
Basophils Relative: 1 %
Eosinophils Absolute: 0.2 10*3/uL (ref 0.0–0.5)
Eosinophils Relative: 2 %
HCT: 40.8 % (ref 36.0–46.0)
Hemoglobin: 14.2 g/dL (ref 12.0–15.0)
Immature Granulocytes: 0 %
Lymphocytes Relative: 24 %
Lymphs Abs: 1.6 10*3/uL (ref 0.7–4.0)
MCH: 29.9 pg (ref 26.0–34.0)
MCHC: 34.8 g/dL (ref 30.0–36.0)
MCV: 85.9 fL (ref 80.0–100.0)
Monocytes Absolute: 0.4 10*3/uL (ref 0.1–1.0)
Monocytes Relative: 6 %
Neutro Abs: 4.5 10*3/uL (ref 1.7–7.7)
Neutrophils Relative %: 67 %
Platelets: 213 10*3/uL (ref 150–400)
RBC: 4.75 MIL/uL (ref 3.87–5.11)
RDW: 12.1 % (ref 11.5–15.5)
WBC: 6.8 10*3/uL (ref 4.0–10.5)
nRBC: 0 % (ref 0.0–0.2)

## 2021-10-15 LAB — LIPID PANEL
Cholesterol: 149 mg/dL (ref 0–200)
HDL: 45 mg/dL (ref >40)
LDL Cholesterol: 66 mg/dL (ref 0–99)
Total CHOL/HDL Ratio: 3.3 RATIO
Triglycerides: 190 mg/dL — ABNORMAL HIGH (ref <150)
VLDL: 38 mg/dL (ref 0–40)

## 2021-10-15 LAB — URINALYSIS, ROUTINE W REFLEX MICROSCOPIC
Bacteria, UA: NONE SEEN
Bilirubin Urine: NEGATIVE
Glucose, UA: NEGATIVE mg/dL
Hgb urine dipstick: NEGATIVE
Ketones, ur: NEGATIVE mg/dL
Nitrite: NEGATIVE
Protein, ur: NEGATIVE mg/dL
Specific Gravity, Urine: 1.024 (ref 1.005–1.030)
pH: 5 (ref 5.0–8.0)

## 2021-10-15 LAB — POCT URINE DRUG SCREEN - MANUAL ENTRY (I-SCREEN)
POC Amphetamine UR: NOT DETECTED
POC Buprenorphine (BUP): NOT DETECTED
POC Cocaine UR: NOT DETECTED
POC Marijuana UR: NOT DETECTED
POC Methadone UR: NOT DETECTED
POC Methamphetamine UR: NOT DETECTED
POC Morphine: NOT DETECTED
POC Oxazepam (BZO): NOT DETECTED
POC Oxycodone UR: NOT DETECTED
POC Secobarbital (BAR): NOT DETECTED

## 2021-10-15 LAB — TSH: TSH: 2.285 u[IU]/mL (ref 0.350–4.500)

## 2021-10-15 LAB — COMPREHENSIVE METABOLIC PANEL
ALT: 51 U/L — ABNORMAL HIGH (ref 0–44)
AST: 30 U/L (ref 15–41)
Albumin: 4.2 g/dL (ref 3.5–5.0)
Alkaline Phosphatase: 72 U/L (ref 38–126)
Anion gap: 8 (ref 5–15)
BUN: 15 mg/dL (ref 6–20)
CO2: 28 mmol/L (ref 22–32)
Calcium: 9.6 mg/dL (ref 8.9–10.3)
Chloride: 104 mmol/L (ref 98–111)
Creatinine, Ser: 1.17 mg/dL — ABNORMAL HIGH (ref 0.44–1.00)
GFR, Estimated: 55 mL/min — ABNORMAL LOW (ref >60)
Glucose, Bld: 87 mg/dL (ref 70–99)
Potassium: 3.3 mmol/L — ABNORMAL LOW (ref 3.5–5.1)
Sodium: 140 mmol/L (ref 135–145)
Total Bilirubin: 0.9 mg/dL (ref 0.3–1.2)
Total Protein: 6.8 g/dL (ref 6.5–8.1)

## 2021-10-15 LAB — RESP PANEL BY RT-PCR (FLU A&B, COVID) ARPGX2
Influenza A by PCR: NEGATIVE
Influenza B by PCR: NEGATIVE
SARS Coronavirus 2 by RT PCR: NEGATIVE

## 2021-10-15 LAB — POC SARS CORONAVIRUS 2 AG: SARSCOV2ONAVIRUS 2 AG: NEGATIVE

## 2021-10-15 LAB — HEMOGLOBIN A1C
Hgb A1c MFr Bld: 5.6 % (ref 4.8–5.6)
Mean Plasma Glucose: 114.02 mg/dL

## 2021-10-15 LAB — POCT PREGNANCY, URINE: Preg Test, Ur: NEGATIVE

## 2021-10-15 MED ORDER — LORAZEPAM 1 MG PO TABS: 1.0000 mg | ORAL_TABLET | ORAL | Status: DC | PRN

## 2021-10-15 MED ORDER — MONTELUKAST SODIUM 10 MG PO TABS
10.0000 mg | ORAL_TABLET | Freq: Every day | ORAL | Status: DC
Start: 2021-10-15 — End: 2021-10-19
  Administered 2021-10-15 – 2021-10-19 (×5): 10 mg via ORAL
  Filled 2021-10-15 (×5): qty 1

## 2021-10-15 MED ORDER — TRAZODONE HCL 50 MG PO TABS: 50.0000 mg | ORAL_TABLET | Freq: Every evening | ORAL | Status: DC | PRN

## 2021-10-15 MED ORDER — MAGNESIUM HYDROXIDE 400 MG/5ML PO SUSP: 30.0000 mL | Freq: Every day | ORAL | Status: DC | PRN

## 2021-10-15 MED ORDER — ZIPRASIDONE MESYLATE 20 MG IM SOLR: 20.0000 mg | INTRAMUSCULAR | Status: DC | PRN

## 2021-10-15 MED ORDER — PROPRANOLOL HCL 20 MG PO TABS
40.0000 mg | ORAL_TABLET | Freq: Every day | ORAL | Status: DC
  Administered 2021-10-15 – 2021-10-19 (×5): 40 mg via ORAL
  Filled 2021-10-15 (×5): qty 2

## 2021-10-15 MED ORDER — FLUPHENAZINE HCL 1 MG PO TABS
1.0000 mg | ORAL_TABLET | Freq: Three times a day (TID) | ORAL | Status: DC
  Administered 2021-10-15 – 2021-10-19 (×12): 1 mg via ORAL
  Filled 2021-10-15 (×12): qty 1

## 2021-10-15 MED ORDER — RISPERIDONE 2 MG PO TBDP: 2.0000 mg | ORAL_TABLET | Freq: Three times a day (TID) | ORAL | Status: DC | PRN

## 2021-10-15 MED ORDER — HYDROXYZINE HCL 25 MG PO TABS
25.0000 mg | ORAL_TABLET | Freq: Three times a day (TID) | ORAL | Status: DC | PRN
  Administered 2021-10-17 – 2021-10-18 (×2): 25 mg via ORAL
  Filled 2021-10-15 (×2): qty 1

## 2021-10-15 MED ORDER — ALUM & MAG HYDROXIDE-SIMETH 200-200-20 MG/5ML PO SUSP: 30.0000 mL | ORAL | Status: DC | PRN

## 2021-10-15 MED ORDER — OLANZAPINE 5 MG PO TBDP
5.0000 mg | ORAL_TABLET | Freq: Three times a day (TID) | ORAL | Status: DC | PRN
Start: 2021-10-15 — End: 2021-10-19

## 2021-10-15 MED ORDER — LORAZEPAM 1 MG PO TABS
1.0000 mg | ORAL_TABLET | ORAL | Status: AC | PRN
  Administered 2021-10-15: 1 mg via ORAL
  Filled 2021-10-15: qty 1

## 2021-10-15 MED ORDER — AMLODIPINE BESYLATE 2.5 MG PO TABS
2.5000 mg | ORAL_TABLET | Freq: Every day | ORAL | Status: DC
  Administered 2021-10-15 – 2021-10-19 (×5): 2.5 mg via ORAL
  Filled 2021-10-15 (×5): qty 1

## 2021-10-15 MED ORDER — ACETAMINOPHEN 325 MG PO TABS: 650.0000 mg | ORAL_TABLET | Freq: Four times a day (QID) | ORAL | Status: DC | PRN

## 2021-10-15 MED ORDER — OLANZAPINE 5 MG PO TBDP: 5.0000 mg | ORAL_TABLET | Freq: Three times a day (TID) | ORAL | Status: DC | PRN

## 2021-10-15 MED ORDER — TRAZODONE HCL 100 MG PO TABS
200.0000 mg | ORAL_TABLET | Freq: Every evening | ORAL | Status: DC | PRN
  Administered 2021-10-15 – 2021-10-18 (×4): 200 mg via ORAL
  Filled 2021-10-15 (×4): qty 2

## 2021-10-15 MED ORDER — PANTOPRAZOLE SODIUM 40 MG PO TBEC
40.0000 mg | DELAYED_RELEASE_TABLET | Freq: Two times a day (BID) | ORAL | Status: DC
  Administered 2021-10-15 – 2021-10-19 (×9): 40 mg via ORAL
  Filled 2021-10-15 (×9): qty 1

## 2021-10-15 MED ORDER — HYDROCHLOROTHIAZIDE 12.5 MG PO TABS
12.5000 mg | ORAL_TABLET | Freq: Every day | ORAL | Status: DC
  Administered 2021-10-16 – 2021-10-19 (×4): 12.5 mg via ORAL
  Filled 2021-10-15 (×4): qty 1

## 2021-10-15 MED ORDER — ATORVASTATIN CALCIUM 10 MG PO TABS
20.0000 mg | ORAL_TABLET | Freq: Every day | ORAL | Status: DC
  Administered 2021-10-15 – 2021-10-19 (×5): 20 mg via ORAL
  Filled 2021-10-15 (×5): qty 2
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
## 2021-10-15 NOTE — Progress Notes (Signed)
?   10/15/21 1045  ?[place] Triage Screening (Walk-ins at [facility] only)  ?How Did You Hear About [place]? Legal System  ?What Is the Reason for Your Visit/Call Today? Patient presents via GPD under IVC, initiated by ALF SW due to concerns she has been increasingly paranoid and agitated.  Per IVC, she has also been refusing meds for several days.  Verdene Lennert, Scientist, physiological, states she just learned the RN was leaving meds with patient to self-administer.  It is likely she has been off of medicaitons for weeks or more, given her current presentation.  ?How Long Has This Been Causing You Problems? 1-6 months  ?Have You Recently Had Any Thoughts About Hurting Yourself? No  ?Are You Planning to Commit Suicide/Harm Yourself At This time? No  ?Have you Recently Had Thoughts About [place]? No  ?Are You Planning To Harm Someone At This Time? No  ?Are you currently experiencing any auditory, visual or other hallucinations? Yes  ?Please explain the hallucinations you are currently experiencing: Patient discusses hearing voices telling "chattering" and recently telling her to go outside of the building and to "walk the building," which she has been doing as they instruct.  ?Have You Used Any Alcohol or Drugs in the Past 24 Hours? No  ?Do you have any current medical co-morbidities that require immediate attention? No  ?Clinician description of patient physical appearance/behavior: Patient is cooperative, however somewhat irritable, as she does not feel she needs to be admitted here under IVC.  ?What Do You Feel Would Help You the Most Today? Treatment for Depression or other mood problem  ?If access to [facility] Urgent Care was not available, would you have sought care in the Emergency Department? Yes  ?Determination of Need Urgent (48 hours)  ?Options For Referral Inpatient Hospitalization  ? ? ?

## 2021-10-15 NOTE — ED Notes (Signed)
Pt A&O x 4, no distress noted. Pacing at present. Calm & cooperative, guarded.  Monitoring for safety. ?

## 2021-10-15 NOTE — Progress Notes (Signed)
Patient has been faxed out due to no appropriate beds available at [facility]. Patient meets [place] inpatient criteria per Lestine Mount, MD. Patient has been faxed out to the following facilities:  ? ?[place]  [address] [place] [address] [phone]  ?[facility]  [address], [place] [address] [phone]  ?[place]  [address] [place] [address] [phone]  ?[place] [place] [address] [phone]  ?[place] [address] [place] [address] [phone]  ?[facility]  [place], [place] [address] [phone]  ?[facility]  [place], [place] [address] [phone]  ?[facility]  [address] [place] [address] [phone]  ?[place] [place] [address] [phone]  ?[facility]  [address] [place] [address] [phone]  ?[facility]  [address] [place] [address] [phone]  ?[facility]  [address] [place] [address] [phone]  ? ?Mariea Clonts, MSW, LCSW-A  ?11:58 AM 10/15/2021   ?
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
## 2021-10-15 NOTE — ED Notes (Signed)
Pt arrived to the unit, patient is cooperative with admission assessments but doesn't understand why she is here. Oriented pt to unit. Offered snacks and liquids.  ?

## 2021-10-15 NOTE — Progress Notes (Deleted)
?   10/15/21 1045  ?[place] Triage Screening (Walk-ins at [facility] only)  ?How Did You Hear About [place]? Legal System  ?What Is the Reason for Your Visit/Call Today? Patient presents via GPD under IVC, initiated by ALF SW due to concerns she has been increasingly paranoid and agitated.  Per IVC, she has also been refusing meds for several days.  Veronic, Scientist, physiological, states she just learned the RN was leaving meds with patient to self-administer.  It is likely she has been off of medicaitons for weeks or more, given her current presentation.  ?How Long Has This Been Causing You Problems? 1-6 months  ?Have You Recently Had Any Thoughts About Hurting Yourself? No  ?Are You Planning to Commit Suicide/Harm Yourself At This time? No  ?Have you Recently Had Thoughts About [place]? No  ?Are You Planning To Harm Someone At This Time? No  ?Are you currently experiencing any auditory, visual or other hallucinations? Yes  ?Please explain the hallucinations you are currently experiencing: Patient discusses hearing voices telling "chattering" and recently telling her to go outside of the building and to "walk the building," which she has been doing as they instruct.  ?Have You Used Any Alcohol or Drugs in the Past 24 Hours? No  ?Do you have any current medical co-morbidities that require immediate attention? No  ?Clinician description of patient physical appearance/behavior: Patient is cooperative, however somewhat irritable, as she does not feel she needs to be admitted here under IVC.  ?What Do You Feel Would Help You the Most Today? Treatment for Depression or other mood problem  ?If access to [facility] was not available, would you have sought care in the Emergency Department? Yes  ?Determination of Need Urgent (48 hours)  ?Options For Referral Inpatient Hospitalization  ? ? ?

## 2021-10-16 DIAGNOSIS — F250 Schizoaffective disorder, bipolar type: Secondary | ICD-10-CM | POA: Diagnosis not present

## 2021-10-16 NOTE — Progress Notes (Signed)
Per Dr. Fatima Sanger, patient meets criteria for inpatient treatment. There are no available beds at [facility] today. CSW re-faxed referrals to the following facilities for review: ? ?[place]  Pending - Request Sent N/A [address] [place] [address] [phone] --  ?[place] N/A [address] [place] [address] [phone] --  ?[facility]  Pending - Request Sent N/A [address] [place] [address] [phone] --  ?[facility]  Pending - Request Sent N/A [address] [place] [address] [phone] --  ?[place] Sent N/A [address], [place] [address] [phone] --  ?[facility]  Pending - Request Sent N/A [place], [place] [address] [phone] --  ?[facility]  Pending - Request Sent N/A [address] [place] [address] [phone] --  ?[place] [phone] [place] [address] [place] --  ?[facility]  Pending - Request Sent N/A [address] [place] [address] [phone] --  ?[facility]  Pending - Request Sent N/A [address] [place] [address] [phone] --  ?[place] N/A [address], [place] [address] [phone] --  ?[facility]  Pending - Request Sent N/A [address] [place] [address] [phone] --  ?[facility]  Pending - No Request Sent N/A [address] [place] [address] [phone] --  ?[place]  Pending - No Request Sent N/A [address] [place] [address] [phone] --  ?[facility]  Pending - No Request Sent N/A [address] [place] [address] [phone] --  ?[facility]  Pending - No Request Sent N/A [address] [place] [address] [phone] --  ?[facility]  Pending - No Request Sent N/A [address] [place], [place] [address] [phone] --  ?[facility]  Pending - No Request Sent N/A [address] [place] [address] [phone] --  ?[facility]  Pending - No Request Sent N/A [address] [place] [address] [phone] --  ?[facility]  Pending - No Request Sent N/A [address] [place] [address] [phone] --  ? ?TTS will continue to seek bed placement. ? ?Glennie Isle, MSW, LCSW-A, LCAS ?Phone: [phone] ?Disposition/TOC ? ?

## 2021-10-16 NOTE — ED Notes (Signed)
Pt sleeping at present, no distress noted, respirations even & unlabored.  Monitoring for safety. ?

## 2021-10-16 NOTE — ED Notes (Signed)
Patient laying in bed eating snacks. Patient remains cooperative at this time.  ?

## 2021-10-16 NOTE — ED Notes (Signed)
Patient A&Ox4. Cooperative with flat affect and minimal interaction. Denies SI,HI, and A/V/H with no plan/intent. Not observed responding to internal stimuli. Denies pain. Med compliant. Adequate appetite and in no current distress.  ?

## 2021-10-16 NOTE — Progress Notes (Signed)
Linton Rump with [facility] contacted CSW in reference to referral sent for placement and to confirm it patient still needed placement. CSW advised that patient is still in need on placement. Linton Rump advise that he will update there list to staff with the provider for further review.  ? ? ?Glennie Isle, MSW, LCSW-A, LCAS ?Phone: [phone] ?Disposition/TOC ? ?

## 2021-10-16 NOTE — ED Notes (Signed)
Pt A&O x 4, no distress noted, resting at present,  Respirations even & Unlabored. Calm, guarded and cooperative.  Pt is IVC.  Monitoring for safety. ?

## 2021-10-16 NOTE — Progress Notes (Signed)
[name] with [facility] contacted CSW requesting additional documentation to further review this patient for placement. [name] asked for a providers note, an updated potassium level, and IVC to be sent to fax number [phone]. Treatment team made aware.  ? ?Glennie Isle, MSW, LCSW-A, LCAS ?Phone: [phone] ?Disposition/TOC ? ?

## 2021-10-16 NOTE — ED Provider Notes (Addendum)
Behavioral Health Progress Note ? ?Date and Time: 10/16/2021 5:05 PM ?Name: [name] ?MRN:  [id number] ? ?Subjective:  [name] is a 56 year old female was seen and evaluated face-to-face.  She continues to present with auditory and delusional ideations. "  I can hear them in the wall, that is why they stole my phone."  She reports she is unsure why she was involuntary committed.  States " you will have to call they and ask." She reported that she called the police to report " someone was messing with my medications, but my phone was gone"  She denied suicidal or homicidal ideations. ? ?[name] has been appropriate, cooperative while on the unit.  She has been medication compliant.  Patient is currently prescribed Prolixin 1 mg p.o. 3 times daily, hydroxyzine 25 mg p.o. 3 times daily orders placed for agitation protocol with Zyprexa and Geodon.  Patient to continue trazodone 200 mg p.o. nightly as needed.  Patient is under involuntary commitment due to paranoia and delusions.  She reports a good appetite.  States she is resting well throughout the night.   ? ?We will continue to recommend inpatient admission at this time.  Support encouragement reassurance was provided. ? ?Charted Mental health history:  "[name] reports a psychiatric history of Schizoaffective Disorder, Bipolar type and MDD.  She reports multiple suicide attempts via OD at ages [age] yrs old.  She reports one episode of self injurious behavior (cutting 7 yrs ago).  She reports multiple hospitalizations latest at [facility] during last summer, does include being at [facility] yrs ago.  She reports no known family history of Diagnosis', Substance use, or Suicides.  She reports a PHM of high BP and high cholesterol.   She reports no history of head trauma or seizures."  ?  ? ?Diagnosis:  ?Final diagnoses:  ?Schizoaffective disorder, bipolar type ([place])  ? ? ?Total Time spent with patient: 15 minutes ? ?Past Psychiatric History:  ?Past Medical History:  ?Past  Medical History:  ?Diagnosis Date  ? Back pain   ? Depression   ? GERD (gastroesophageal reflux disease)   ? High cholesterol   ? Schizoaffective disorder ([place])   ?  ?Past Surgical History:  ?Procedure Laterality Date  ? COLONOSCOPY    ? GASTROSTOMY W/ FEEDING TUBE    ? 16 yrs ago  ? MM BREAST STEREO BIOPSY LEFT ([place] [facility])    ? benign fibroid  ? TONSILLECTOMY AND ADENOIDECTOMY    ? as a child  ? UPPER GASTROINTESTINAL ENDOSCOPY    ? ?Family History:  ?Family History  ?Problem Relation Age of Onset  ? Esophageal cancer Neg Hx   ? Pancreatic cancer Neg Hx   ? Liver disease Neg Hx   ? Rectal cancer Neg Hx   ? Colon cancer Neg Hx   ? ?Family Psychiatric  History:  ?Social History:  ?Social History  ? ?Substance and Sexual Activity  ?Alcohol Use Not Currently  ? Comment: occ  ?   ?Social History  ? ?Substance and Sexual Activity  ?Drug Use Never  ?  ?Social History  ? ?Socioeconomic History  ? Marital status: Divorced  ?  Spouse name: Not on file  ? Number of children: Not on file  ? Years of education: Not on file  ? Highest education level: Not on file  ?Occupational History  ? Not on file  ?Tobacco Use  ? Smoking status: Never  ?  Passive exposure: Never  ? Smokeless tobacco: Never  ?Vaping  Use  ? Vaping Use: Never used  ?Substance and Sexual Activity  ? Alcohol use: Not Currently  ?  Comment: occ  ? Drug use: Never  ? Sexual activity: Not Currently  ?  Birth control/protection: None  ?Other Topics Concern  ? Not on file  ?Social History Narrative  ? Not on file  ? ?Social Determinants of Health  ? ?Financial Resource Strain: Not on file  ?Food Insecurity: Not on file  ?Transportation Needs: Not on file  ?Physical Activity: Not on file  ?Stress: Not on file  ?Social Connections: Not on file  ? ?SDOH:  ?SDOH Screenings  ? ?Alcohol Screen: Not on file  ?Depression (PHQ2-9): Medium Risk  ? PHQ-2 Score: 7  ?Financial Resource Strain: Not on file  ?Food Insecurity: Not on file  ?Housing: Not on file  ?Physical Activity: Not  on file  ?Social Connections: Not on file  ?Stress: Not on file  ?Tobacco Use: Low Risk   ? Smoking Tobacco Use: Never  ? Smokeless Tobacco Use: Never  ? Passive Exposure: Never  ?Transportation Needs: Not on file  ? ?Additional Social History:  ?  ?Pain Medications: Please see MAR ?Prescriptions: Please see MAR ?Over the Counter: Please see MAR ?History of alcohol / drug use?: No history of alcohol / drug abuse ?Longest period of sobriety (when/how long): Pt denies SA hx ?  ?  ?  ?  ?  ?  ?  ?  ?  ? ?Sleep: Fair ? ?Appetite:  Fair ? ?Current Medications:  ?Current Facility-Administered Medications  ?Medication Dose Route Frequency Provider Last Rate Last Admin  ? acetaminophen (TYLENOL) tablet 650 mg  650 mg Oral Q6H PRN Briant Cedar, MD      ? alum & mag hydroxide-simeth (MAALOX/MYLANTA) 200-200-20 MG/5ML suspension 30 mL  30 mL Oral Q4H PRN Briant Cedar, MD      ? amLODipine (NORVASC) tablet 2.5 mg  2.5 mg Oral Daily Briant Cedar, MD   2.5 mg at 10/16/21 1610  ? atorvastatin (LIPITOR) tablet 20 mg  20 mg Oral Daily Briant Cedar, MD   20 mg at 10/16/21 9604  ? fluPHENAZine (PROLIXIN) tablet 1 mg  1 mg Oral TID Briant Cedar, MD   1 mg at 10/16/21 5409  ? hydrochlorothiazide (HYDRODIURIL) tablet 12.5 mg  12.5 mg Oral Daily Briant Cedar, MD   12.5 mg at 10/16/21 8119  ? hydrOXYzine (ATARAX) tablet 25 mg  25 mg Oral TID PRN Briant Cedar, MD      ? magnesium hydroxide (MILK OF MAGNESIA) suspension 30 mL  30 mL Oral Daily PRN Briant Cedar, MD      ? montelukast (SINGULAIR) tablet 10 mg  10 mg Oral Daily Briant Cedar, MD   10 mg at 10/16/21 1478  ? OLANZapine zydis (ZYPREXA) disintegrating tablet 5 mg  5 mg Oral Q8H PRN Briant Cedar, MD      ? And  ? ziprasidone (GEODON) injection 20 mg  20 mg Intramuscular PRN Briant Cedar, MD      ? pantoprazole (PROTONIX) EC tablet 40 mg  40 mg Oral BID Briant Cedar, MD   40 mg  at 10/16/21 2956  ? propranolol (INDERAL) tablet 40 mg  40 mg Oral Daily Briant Cedar, MD   40 mg at 10/16/21 2130  ? traZODone (DESYREL) tablet 200 mg  200 mg Oral QHS PRN Briant Cedar, MD   200 mg at  10/15/21 2143  ? ?Current Outpatient Medications  ?Medication Sig Dispense Refill  ? amLODipine (NORVASC) 2.5 MG tablet Take 2.5 mg by mouth daily.    ? atorvastatin (LIPITOR) 20 MG tablet Take 20 mg by mouth daily.    ? hydrochlorothiazide (HYDRODIURIL) 12.5 MG tablet Take 12.5 mg by mouth daily.    ? montelukast (SINGULAIR) 10 MG tablet Take 10 mg by mouth daily.    ? pantoprazole (PROTONIX) 40 MG tablet Take 1 tablet (40 mg total) by mouth 2 (two) times daily. (Patient taking differently: Take 40 mg by mouth daily.) 90 tablet 3  ? propranolol (INDERAL) 40 MG tablet Take 40 mg by mouth daily.    ? traZODone (DESYREL) 100 MG tablet Take 200 mg by mouth at bedtime as needed for sleep.    ? ? ?Labs  ?Lab Results:  ?Admission on 10/15/2021  ?Component Date Value Ref Range Status  ? SARS Coronavirus 2 by RT PCR 10/15/2021 NEGATIVE  NEGATIVE Final  ? Comment: (NOTE) ?SARS-CoV-2 target nucleic acids are NOT DETECTED. ? ?The SARS-CoV-2 RNA is generally detectable in upper respiratory ?specimens during the acute phase of infection. The lowest ?concentration of SARS-CoV-2 viral copies this assay can detect is ?138 copies/mL. A negative result does not preclude SARS-Cov-2 ?infection and should not be used as the sole basis for treatment or ?other patient management decisions. A negative result may occur with  ?improper specimen collection/handling, submission of specimen other ?than nasopharyngeal swab, presence of viral mutation(s) within the ?areas targeted by this assay, and inadequate number of viral ?copies(<138 copies/mL). A negative result must be combined with ?clinical observations, patient history, and epidemiological ?information. The expected result is Negative. ? ?Fact Sheet for Patients:   ?EntrepreneurPulse.com.au ? ?Fact Sheet for Healthcare Providers:  ?IncredibleEmployment.be ? ?This test is no  ?                        t yet approved or cleared by the

## 2021-10-16 NOTE — ED Notes (Signed)
Pt is in the bed sleeping. Respirations are even and unlabored. No acute distress noted .will continue to monitor for safety. ?

## 2021-10-17 DIAGNOSIS — F250 Schizoaffective disorder, bipolar type: Secondary | ICD-10-CM | POA: Diagnosis not present

## 2021-10-17 LAB — POTASSIUM: Potassium: 3.5 mmol/L (ref 3.5–5.1)

## 2021-10-17 MED ORDER — POTASSIUM CHLORIDE CRYS ER 20 MEQ PO TBCR
20.0000 meq | EXTENDED_RELEASE_TABLET | Freq: Every day | ORAL | Status: AC
  Administered 2021-10-17 – 2021-10-18 (×2): 20 meq via ORAL
  Filled 2021-10-17 (×2): qty 1

## 2021-10-17 MED ORDER — POTASSIUM CHLORIDE CRYS ER 20 MEQ PO TBCR: 20.0000 meq | EXTENDED_RELEASE_TABLET | ORAL | Status: DC

## 2021-10-17 NOTE — ED Notes (Signed)
Pt is up walking back and forth on the unit. ?

## 2021-10-17 NOTE — ED Notes (Signed)
Pt sleeping at present.  No distress noted.  Monitoring for safety. 

## 2021-10-17 NOTE — ED Notes (Signed)
Pt sleeping at present, no distress noted.respirations even & unlabored.  Monitoring for safety. ?

## 2021-10-17 NOTE — ED Notes (Signed)
Pt continues to sleep quietly. Will continue to monitor pt for safety. ?

## 2021-10-17 NOTE — ED Notes (Signed)
Pt A&O x 4, calm & cooperative, no distress noted. Remains guarded, forwards little.  Monitoring for safety. ?

## 2021-10-17 NOTE — ED Notes (Signed)
Pt was given sub, chips, and juice for lunch. Pt placed lunch in chair and soon after pt rolled over in bed.  ?

## 2021-10-17 NOTE — ED Notes (Signed)
Pt resting quietly in view of nursing station.  Breathing even and unlabored.  No distress noted. Will continue to monitor for safety.  ?

## 2021-10-17 NOTE — ED Notes (Signed)
Attempted to draw blood in Rt Arm but pt asked to "pull the needle out" before specimen obtained.  Pt refused to allow staff to attempt again.  ?

## 2021-10-17 NOTE — ED Notes (Signed)
Pt refused dinner. Pt still have lunch beside her bed as well.  ?

## 2021-10-17 NOTE — ED Provider Notes (Signed)
Behavioral Health Progress Note ? ?Date and Time: 10/17/2021 3:02 PM ?Name: [name] ?MRN:  [id number] ? ?Subjective:  [name] reported " I am doing fine." ? ?Evaluation: [name] was seen and evaluated face-to-face.  She continues to present paranoid with delusional statements but pleasant.  She is denying suicidal or homicidal ideations.  Denies auditory visual hallucinations.   ? ?Per nursing staff patient continues to be medication compliant denying any medication side effects.  It was reported that patient has been refusing potassium level and EKG.  RN staff reports second attempt on this evening. [name] reports a good appetite.  States she is resting well throughout the night.  Staff to continue to monitor for safety.  CSW to continue seeking inpatient admission. ? ?Diagnosis:  ?Final diagnoses:  ?Schizoaffective disorder, bipolar type ([name])  ? ? ?Total Time spent with patient: 15 minutes ? ?Past Psychiatric History:  ?Past Medical History:  ?Past Medical History:  ?Diagnosis Date  ? Back pain   ? Depression   ? GERD (gastroesophageal reflux disease)   ? High cholesterol   ? Schizoaffective disorder ([name])   ?  ?Past Surgical History:  ?Procedure Laterality Date  ? COLONOSCOPY    ? GASTROSTOMY W/ FEEDING TUBE    ? 16 yrs ago  ? MM BREAST STEREO BIOPSY LEFT ([place] HX)    ? benign fibroid  ? TONSILLECTOMY AND ADENOIDECTOMY    ? as a child  ? UPPER GASTROINTESTINAL ENDOSCOPY    ? ?Family History:  ?Family History  ?Problem Relation Age of Onset  ? Esophageal cancer Neg Hx   ? Pancreatic cancer Neg Hx   ? Liver disease Neg Hx   ? Rectal cancer Neg Hx   ? Colon cancer Neg Hx   ? ?Family Psychiatric  History:  ?Social History:  ?Social History  ? ?Substance and Sexual Activity  ?Alcohol Use Not Currently  ? Comment: occ  ?   ?Social History  ? ?Substance and Sexual Activity  ?Drug Use Never  ?  ?Social History  ? ?Socioeconomic History  ? Marital status: Divorced  ?  Spouse name: Not on file  ? Number of children: Not  on file  ? Years of education: Not on file  ? Highest education level: Not on file  ?Occupational History  ? Not on file  ?Tobacco Use  ? Smoking status: Never  ?  Passive exposure: Never  ? Smokeless tobacco: Never  ?Vaping Use  ? Vaping Use: Never used  ?Substance and Sexual Activity  ? Alcohol use: Not Currently  ?  Comment: occ  ? Drug use: Never  ? Sexual activity: Not Currently  ?  Birth control/protection: None  ?Other Topics Concern  ? Not on file  ?Social History Narrative  ? Not on file  ? ?Social Determinants of Health  ? ?Financial Resource Strain: Not on file  ?Food Insecurity: Not on file  ?Transportation Needs: Not on file  ?Physical Activity: Not on file  ?Stress: Not on file  ?Social Connections: Not on file  ? ?SDOH:  ?SDOH Screenings  ? ?Alcohol Screen: Not on file  ?Depression (PHQ2-9): Medium Risk  ? PHQ-2 Score: 7  ?Financial Resource Strain: Not on file  ?Food Insecurity: Not on file  ?Housing: Not on file  ?Physical Activity: Not on file  ?Social Connections: Not on file  ?Stress: Not on file  ?Tobacco Use: Low Risk   ? Smoking Tobacco Use: Never  ? Smokeless Tobacco Use: Never  ? Passive Exposure:  Never  ?Transportation Needs: Not on file  ? ?Additional Social History:  ?  ?Pain Medications: Please see MAR ?Prescriptions: Please see MAR ?Over the Counter: Please see MAR ?History of alcohol / drug use?: No history of alcohol / drug abuse ?Longest period of sobriety (when/how long): Pt denies SA hx ?  ?  ?  ?  ?  ?  ?  ?  ?  ? ?Sleep: Good ? ?Appetite:  Good ? ?Current Medications:  ?Current Facility-Administered Medications  ?Medication Dose Route Frequency Provider Last Rate Last Admin  ? acetaminophen (TYLENOL) tablet 650 mg  650 mg Oral Q6H PRN Briant Cedar, MD      ? alum & mag hydroxide-simeth (MAALOX/MYLANTA) 200-200-20 MG/5ML suspension 30 mL  30 mL Oral Q4H PRN Briant Cedar, MD      ? amLODipine (NORVASC) tablet 2.5 mg  2.5 mg Oral Daily Briant Cedar, MD    2.5 mg at 10/17/21 1106  ? atorvastatin (LIPITOR) tablet 20 mg  20 mg Oral Daily Briant Cedar, MD   20 mg at 10/17/21 1038  ? fluPHENAZine (PROLIXIN) tablet 1 mg  1 mg Oral TID Briant Cedar, MD   1 mg at 10/17/21 1041  ? hydrochlorothiazide (HYDRODIURIL) tablet 12.5 mg  12.5 mg Oral Daily Briant Cedar, MD   12.5 mg at 10/17/21 1037  ? hydrOXYzine (ATARAX) tablet 25 mg  25 mg Oral TID PRN Briant Cedar, MD      ? magnesium hydroxide (MILK OF MAGNESIA) suspension 30 mL  30 mL Oral Daily PRN Briant Cedar, MD      ? montelukast (SINGULAIR) tablet 10 mg  10 mg Oral Daily Briant Cedar, MD   10 mg at 10/17/21 1037  ? OLANZapine zydis (ZYPREXA) disintegrating tablet 5 mg  5 mg Oral Q8H PRN Briant Cedar, MD      ? And  ? ziprasidone (GEODON) injection 20 mg  20 mg Intramuscular PRN Briant Cedar, MD      ? pantoprazole (PROTONIX) EC tablet 40 mg  40 mg Oral BID Briant Cedar, MD   40 mg at 10/17/21 1038  ? potassium chloride SA (KLOR-CON M) CR tablet 20 mEq  20 mEq Oral Daily D[place], NP      ? propranolol (INDERAL) tablet 40 mg  40 mg Oral Daily Briant Cedar, MD   40 mg at 10/17/21 1037  ? traZODone (DESYREL) tablet 200 mg  200 mg Oral QHS PRN Briant Cedar, MD   200 mg at 10/16/21 2122  ? ?Current Outpatient Medications  ?Medication Sig Dispense Refill  ? amLODipine (NORVASC) 2.5 MG tablet Take 2.5 mg by mouth daily.    ? atorvastatin (LIPITOR) 20 MG tablet Take 20 mg by mouth daily.    ? hydrochlorothiazide (HYDRODIURIL) 12.5 MG tablet Take 12.5 mg by mouth daily.    ? montelukast (SINGULAIR) 10 MG tablet Take 10 mg by mouth daily.    ? pantoprazole (PROTONIX) 40 MG tablet Take 1 tablet (40 mg total) by mouth 2 (two) times daily. (Patient taking differently: Take 40 mg by mouth daily.) 90 tablet 3  ? propranolol (INDERAL) 40 MG tablet Take 40 mg by mouth daily.    ? traZODone (DESYREL) 100 MG tablet Take 200 mg by mouth  at bedtime as needed for sleep.    ? ? ?Labs  ?Lab Results:  ?Admission on 10/15/2021  ?Component Date Value Ref Range Status  ?  SARS Coronavirus 2 by RT PCR 10/15/2021 NEGATIVE  NEGATIVE Final  ? Comment: (NOTE) ?SARS-CoV-2 target nucleic acids are NOT DETECTED. ? ?The SARS-CoV-2 RNA is generally detectable in upper respiratory ?specimens during the acute phase of infection. The lowest ?concentration of SARS-CoV-2 viral copies this assay can detect is ?138 copies/mL. A negative result does not preclude SARS-Cov-2 ?infection and should not be used as the sole basis for treatment or ?other patient management decisions. A negative result may occur with  ?improper specimen collection/handling, submission of specimen other ?than nasopharyngeal swab, presence of viral mutation(s) within the ?areas targeted by this assay, and inadequate number of viral ?copies(<138 copies/mL). A negative result must be combined with ?clinical observations, patient history, and epidemiological ?information. The expected result is Negative. ? ?Fact Sheet for Patients:  ?EntrepreneurPulse.com.au ? ?Fact Sheet for Healthcare Providers:  ?IncredibleEmployment.be ? ?This test is no  ?                        t yet approved or cleared by the [place] FDA and  ?has been authorized for detection and/or diagnosis of SARS-CoV-2 by ?FDA under an Emergency Use Authorization (EUA). This EUA will remain  ?in effect (meaning this test can be used) for the duration of the ?COVID-19 declaration under Section 564(b)(1) of the Act, 21 ?U.S.C.section 360bbb-3(b)(1), unless the authorization is terminated  ?or revoked sooner.  ? ? ?  ? Influenza A by PCR 10/15/2021 NEGATIVE  NEGATIVE Final  ? Influenza B by PCR 10/15/2021 NEGATIVE  NEGATIVE Final  ? Comment: (NOTE) ?The Xpert Xpress SARS-CoV-2/FLU/RSV plus assay is intended as an aid ?in the diagnosis of influenza from Nasopharyngeal swab specimens and ?should not be used  as a sole basis for treatment. Nasal washings and ?aspirates are unacceptable for Xpert Xpress SARS-CoV-2/FLU/RSV ?testing. ? ?Fact Sheet for Patients: ?EntrepreneurPulse.com.au ? ?Fac

## 2021-10-17 NOTE — ED Provider Notes (Addendum)
Per CSW- Susie with [facility] contacted CSW requesting additional documentation to further review this patient for placement. Susie asked for a providers note, an updated potassium level, and IVC to be sent to fax number [phone].  ? ?NP asked SW to follow up with acceptances. ?

## 2021-10-17 NOTE — ED Notes (Addendum)
Pt is walking back and forth on the unit. Pt has a flat affect. Will continue to monitor for safety.  ?

## 2021-10-17 NOTE — ED Notes (Signed)
Pt is awake and alert.  She has been pacing in unit but is redirectable with a blunted affect.  Pt denies SI, HI or AVH when asked.  She remains Medication compliant.  Staff will continue to monitor for safety.  ?

## 2021-10-17 NOTE — ED Notes (Signed)
Pt resting quietly.  Breathing even and unlabored.  In view of nursing station.   No distress noted.  ?

## 2021-10-18 DIAGNOSIS — F250 Schizoaffective disorder, bipolar type: Secondary | ICD-10-CM | POA: Diagnosis not present

## 2021-10-18 NOTE — ED Notes (Signed)
Pt was given dinner. 

## 2021-10-18 NOTE — ED Notes (Signed)
Pt resting quietly with eyes closed.  No pain or discomfort noted/voiced.  Breathing is even and unlabored. Denies SI, HI, and AVH.  Will continue to monitor for safety.   ?

## 2021-10-18 NOTE — Progress Notes (Signed)
CSW faxed additional labs for review for [facility] admissions. CSW will follow-up with [facility] regarding possible acceptance.  ? ? ?Mariea Clonts, MSW, LCSW-A  ?9:42 AM 10/18/2021   ?

## 2021-10-18 NOTE — Progress Notes (Signed)
CSW received a phone call from [facility] that pt does not have any insurance.CSW informed that pt's facesheet is showing that pt is covered by Medicare and Medicaid. CSW sent facesheet along with [facility], and nursing/ provider notes. CSW will assist and follow with placement options for inpatient behavioral health. ? ? ?Benjaman Kindler, MSW, LCSWA ?10/18/2021 11:55 PM ? ? ?

## 2021-10-18 NOTE — ED Notes (Signed)
Pt sleeping at present, no distress noted.  Monitoring for safety. 

## 2021-10-18 NOTE — ED Provider Notes (Signed)
Behavioral Health Progress Note ? ?Date and Time: 10/18/2021 12:34 PM ?Name: [name] ?MRN:  [id number] ? ?Subjective:   [name] reported " I am doing fine." ? ? ?Per staff patient continues to be compliant with medication; however, patient has not eaten lunch or dinner.  Patient did rest well overnight per staff. ? ?On assessment today patient reports that she slept fairly well and endorses that she does not have much of an appetite.  Patient reports that despite that she will attempt to eat breakfast this a.m.  Patient reports she does not recall most of the events that led to her hospitalization.  Patient denies SI, HI and AVH and assessment.   ? ?Diagnosis:  ?Final diagnoses:  ?Schizoaffective disorder, bipolar type ([place])  ? ? ?Total Time spent with patient: 20 minutes ? ?Past Psychiatric History: See H&P ?Past Medical History:  ?Past Medical History:  ?Diagnosis Date  ? Back pain   ? Depression   ? GERD (gastroesophageal reflux disease)   ? High cholesterol   ? Schizoaffective disorder ([place])   ?  ?Past Surgical History:  ?Procedure Laterality Date  ? COLONOSCOPY    ? GASTROSTOMY W/ FEEDING TUBE    ? 17 yrs ago  ? MM BREAST STEREO BIOPSY LEFT ([facility])    ? benign fibroid  ? TONSILLECTOMY AND ADENOIDECTOMY    ? as a child  ? UPPER GASTROINTESTINAL ENDOSCOPY    ? ?Family History:  ?Family History  ?Problem Relation Age of Onset  ? Esophageal cancer Neg Hx   ? Pancreatic cancer Neg Hx   ? Liver disease Neg Hx   ? Rectal cancer Neg Hx   ? Colon cancer Neg Hx   ? ?Family Psychiatric  History: See H&P ?Social History:  ?Social History  ? ?Substance and Sexual Activity  ?Alcohol Use Not Currently  ? Comment: occ  ?   ?Social History  ? ?Substance and Sexual Activity  ?Drug Use Never  ?  ?Social History  ? ?Socioeconomic History  ? Marital status: Divorced  ?  Spouse name: Not on file  ? Number of children: Not on file  ? Years of education: Not on file  ? Highest education level: Not on file  ?Occupational History  ?  Not on file  ?Tobacco Use  ? Smoking status: Never  ?  Passive exposure: Never  ? Smokeless tobacco: Never  ?Vaping Use  ? Vaping Use: Never used  ?Substance and Sexual Activity  ? Alcohol use: Not Currently  ?  Comment: occ  ? Drug use: Never  ? Sexual activity: Not Currently  ?  Birth control/protection: None  ?Other Topics Concern  ? Not on file  ?Social History Narrative  ? Not on file  ? ?Social Determinants of Health  ? ?Financial Resource Strain: Not on file  ?Food Insecurity: Not on file  ?Transportation Needs: Not on file  ?Physical Activity: Not on file  ?Stress: Not on file  ?Social Connections: Not on file  ? ?SDOH:  ?SDOH Screenings  ? ?Alcohol Screen: Not on file  ?Depression (PHQ2-9): Medium Risk  ? PHQ-2 Score: 7  ?Financial Resource Strain: Not on file  ?Food Insecurity: Not on file  ?Housing: Not on file  ?Physical Activity: Not on file  ?Social Connections: Not on file  ?Stress: Not on file  ?Tobacco Use: Low Risk   ? Smoking Tobacco Use: Never  ? Smokeless Tobacco Use: Never  ? Passive Exposure: Never  ?Transportation Needs: Not on file  ? ?  Additional Social History:  ?  ?Pain Medications: Please see MAR ?Prescriptions: Please see MAR ?Over the Counter: Please see MAR ?History of alcohol / drug use?: No history of alcohol / drug abuse ?Longest period of sobriety (when/how long): Pt denies SA hx ?  ?  ?  ?  ?  ?  ?  ?  ?  ? ?Sleep: Good ? ?Appetite:  Poor ? ?Current Medications:  ?Current Facility-Administered Medications  ?Medication Dose Route Frequency Provider Last Rate Last Admin  ? acetaminophen (TYLENOL) tablet 650 mg  650 mg Oral Q6H PRN Briant Cedar, MD      ? alum & mag hydroxide-simeth (MAALOX/MYLANTA) 200-200-20 MG/5ML suspension 30 mL  30 mL Oral Q4H PRN Briant Cedar, MD      ? amLODipine (NORVASC) tablet 2.5 mg  2.5 mg Oral Daily Briant Cedar, MD   2.5 mg at 10/18/21 1003  ? atorvastatin (LIPITOR) tablet 20 mg  20 mg Oral Daily Briant Cedar, MD    20 mg at 10/18/21 1003  ? fluPHENAZine (PROLIXIN) tablet 1 mg  1 mg Oral TID Briant Cedar, MD   1 mg at 10/18/21 1003  ? hydrochlorothiazide (HYDRODIURIL) tablet 12.5 mg  12.5 mg Oral Daily Briant Cedar, MD   12.5 mg at 10/18/21 1003  ? hydrOXYzine (ATARAX) tablet 25 mg  25 mg Oral TID PRN Briant Cedar, MD   25 mg at 10/17/21 2123  ? magnesium hydroxide (MILK OF MAGNESIA) suspension 30 mL  30 mL Oral Daily PRN Briant Cedar, MD      ? montelukast (SINGULAIR) tablet 10 mg  10 mg Oral Daily Briant Cedar, MD   10 mg at 10/18/21 1004  ? OLANZapine zydis (ZYPREXA) disintegrating tablet 5 mg  5 mg Oral Q8H PRN Briant Cedar, MD      ? And  ? ziprasidone (GEODON) injection 20 mg  20 mg Intramuscular PRN Briant Cedar, MD      ? pantoprazole (PROTONIX) EC tablet 40 mg  40 mg Oral BID Briant Cedar, MD   40 mg at 10/18/21 1003  ? propranolol (INDERAL) tablet 40 mg  40 mg Oral Daily Briant Cedar, MD   40 mg at 10/18/21 1003  ? traZODone (DESYREL) tablet 200 mg  200 mg Oral QHS PRN Briant Cedar, MD   200 mg at 10/17/21 2123  ? ?Current Outpatient Medications  ?Medication Sig Dispense Refill  ? amLODipine (NORVASC) 2.5 MG tablet Take 2.5 mg by mouth daily.    ? atorvastatin (LIPITOR) 20 MG tablet Take 20 mg by mouth daily.    ? hydrochlorothiazide (HYDRODIURIL) 12.5 MG tablet Take 12.5 mg by mouth daily.    ? montelukast (SINGULAIR) 10 MG tablet Take 10 mg by mouth daily.    ? pantoprazole (PROTONIX) 40 MG tablet Take 1 tablet (40 mg total) by mouth 2 (two) times daily. (Patient taking differently: Take 40 mg by mouth daily.) 90 tablet 3  ? propranolol (INDERAL) 40 MG tablet Take 40 mg by mouth daily.    ? traZODone (DESYREL) 100 MG tablet Take 200 mg by mouth at bedtime as needed for sleep.    ? ? ?Labs  ?Lab Results:  ?Admission on 10/15/2021  ?Component Date Value Ref Range Status  ? SARS Coronavirus 2 by RT PCR 10/15/2021 NEGATIVE   NEGATIVE Final  ? Comment: (NOTE) ?SARS-CoV-2 target nucleic acids are NOT DETECTED. ? ?The SARS-CoV-2 RNA is generally detectable  in upper respiratory ?specimens during the acute phase of infection. The lowest ?concentration of SARS-CoV-2 viral copies this assay can detect is ?138 copies/mL. A negative result does not preclude SARS-Cov-2 ?infection and should not be used as the sole basis for treatment or ?other patient management decisions. A negative result may occur with  ?improper specimen collection/handling, submission of specimen other ?than nasopharyngeal swab, presence of viral mutation(s) within the ?areas targeted by this assay, and inadequate number of viral ?copies(<138 copies/mL). A negative result must be combined with ?clinical observations, patient history, and epidemiological ?information. The expected result is Negative. ? ?Fact Sheet for Patients:  ?EntrepreneurPulse.com.au ? ?Fact Sheet for Healthcare Providers:  ?IncredibleEmployment.be ? ?This test is no  ?                        t yet approved or cleared by the [place] FDA and  ?has been authorized for detection and/or diagnosis of SARS-CoV-2 by ?FDA under an Emergency Use Authorization (EUA). This EUA will remain  ?in effect (meaning this test can be used) for the duration of the ?COVID-19 declaration under Section 564(b)(1) of the Act, 21 ?U.S.C.section 360bbb-3(b)(1), unless the authorization is terminated  ?or revoked sooner.  ? ? ?  ? Influenza A by PCR 10/15/2021 NEGATIVE  NEGATIVE Final  ? Influenza B by PCR 10/15/2021 NEGATIVE  NEGATIVE Final  ? Comment: (NOTE) ?The Xpert Xpress SARS-CoV-2/FLU/RSV plus assay is intended as an aid ?in the diagnosis of influenza from Nasopharyngeal swab specimens and ?should not be used as a sole basis for treatment. Nasal washings and ?aspirates are unacceptable for Xpert Xpress SARS-CoV-2/FLU/RSV ?testing. ? ?Fact Sheet for  Patients: ?EntrepreneurPulse.com.au ? ?Fact Sheet for Healthcare Providers: ?IncredibleEmployment.be ? ?This test is not yet approved or cleared by the [place] and ?has been authorized for detection an

## 2021-10-19 DIAGNOSIS — F250 Schizoaffective disorder, bipolar type: Secondary | ICD-10-CM | POA: Diagnosis not present

## 2021-10-19 MED ORDER — FLUPHENAZINE HCL 1 MG PO TABS: 1.0000 mg | ORAL_TABLET | Freq: Three times a day (TID) | ORAL | 0 refills | Status: AC

## 2021-10-19 MED ORDER — HYDROXYZINE HCL 25 MG PO TABS: 25.0000 mg | ORAL_TABLET | Freq: Three times a day (TID) | ORAL | 0 refills | Status: AC | PRN

## 2021-10-19 NOTE — ED Notes (Signed)
Pt sleeping in no acute distress. RR even and unlabored. Safety maintained. 

## 2021-10-19 NOTE — ED Notes (Addendum)
Pt sleeping but easily aroused to name being called. Denies SI/HI/AVH. Denies concerns or needs at present. Pt eating bkft. Calm, cooperative with staff during assessment. Informed pt to notify staff with any neees or concerns.  ?

## 2021-10-19 NOTE — ED Provider Notes (Signed)
FBC/OBS ASAP Discharge Summary ? ?Date and Time: 10/19/2021 9:04 AM  ?Name: [name]  ?MRN:  [id number]  ? ?Discharge Diagnoses:  ?Final diagnoses:  ?Schizoaffective disorder, bipolar type ([place])  ? ? ?Subjective: [name] is a 56 yr old female who presents under IVC via [employer] due to paranoia/delusions and being agitated and aggressive with staff at her Assisted Living facility.  PPHx is significant for Legal Guardian (mother [name]), Schizoaffective Disorder, Bipolar Type, MDD, multiple suicide attempts via OD when [age] yrs old, one episode of self injurious behavior (cutting 7 yrs ago), and multiple hospitalizations (latest [facility] summer 2022). ? ?Stay Summary:  ?Patient presented on 5/12 under IVC. Patient started on Prolixin '1mg'$  TID, and required a PRN '1mg'$  Ativan on day of admission. Patient required no further PRNs for behavior. Patient did required PRN Atarax '25mg'$  x2 and a PRN Trazodone '200mg'$  QHS for insomnia. Patient was able to stabilize on Prolixin '1mg'$  TID. Patient was also noted to be hypokalemic on admission. Patient did required K supplementation and was able to return to 3.5 K+. Patient was continued on her home HTN medications. Patient briefly had a decrease in her appetite, but this returned to normal. Patient was noted to sleep very well with no aggressiveness on the unit. Patient had no incidences with other patient's on the unit. At time of discharge patient was denying SI, HI, and AVH and felt ready to return to her living facility.  ? ?Total Time spent with patient: 20 minutes ? ?Past Psychiatric History:   Legal Guardian (mother [name]), Schizoaffective Disorder, Bipolar Type, MDD, multiple suicide attempts via OD when [age] yrs old, one episode of self injurious behavior (cutting 7 yrs ago), and multiple hospitalizations (latest [facility] summer 2022). ?Past Medical History:  ?Past Medical History:  ?Diagnosis Date  ? Back pain   ? Depression   ? GERD (gastroesophageal reflux  disease)   ? High cholesterol   ? Schizoaffective disorder ([place])   ?  ?Past Surgical History:  ?Procedure Laterality Date  ? COLONOSCOPY    ? GASTROSTOMY W/ FEEDING TUBE    ? 17 yrs ago  ? MM BREAST STEREO BIOPSY LEFT ([facility])    ? benign fibroid  ? TONSILLECTOMY AND ADENOIDECTOMY    ? as a child  ? UPPER GASTROINTESTINAL ENDOSCOPY    ? ?Family History:  ?Family History  ?Problem Relation Age of Onset  ? Esophageal cancer Neg Hx   ? Pancreatic cancer Neg Hx   ? Liver disease Neg Hx   ? Rectal cancer Neg Hx   ? Colon cancer Neg Hx   ? ?Family Psychiatric History: Unknown ?Social History:  ?Social History  ? ?Substance and Sexual Activity  ?Alcohol Use Not Currently  ? Comment: occ  ?   ?Social History  ? ?Substance and Sexual Activity  ?Drug Use Never  ?  ?Social History  ? ?Socioeconomic History  ? Marital status: Divorced  ?  Spouse name: Not on file  ? Number of children: Not on file  ? Years of education: Not on file  ? Highest education level: Not on file  ?Occupational History  ? Not on file  ?Tobacco Use  ? Smoking status: Never  ?  Passive exposure: Never  ? Smokeless tobacco: Never  ?Vaping Use  ? Vaping Use: Never used  ?Substance and Sexual Activity  ? Alcohol use: Not Currently  ?  Comment: occ  ? Drug use: Never  ? Sexual activity: Not Currently  ?  Birth control/protection: None  ?Other Topics Concern  ? Not on file  ?Social History Narrative  ? Not on file  ? ?Social Determinants of Health  ? ?Financial Resource Strain: Not on file  ?Food Insecurity: Not on file  ?Transportation Needs: Not on file  ?Physical Activity: Not on file  ?Stress: Not on file  ?Social Connections: Not on file  ? ?SDOH:  ?SDOH Screenings  ? ?Alcohol Screen: Not on file  ?Depression (PHQ2-9): Medium Risk  ? PHQ-2 Score: 7  ?Financial Resource Strain: Not on file  ?Food Insecurity: Not on file  ?Housing: Not on file  ?Physical Activity: Not on file  ?Social Connections: Not on file  ?Stress: Not on file  ?Tobacco Use: Low Risk    ? Smoking Tobacco Use: Never  ? Smokeless Tobacco Use: Never  ? Passive Exposure: Never  ?Transportation Needs: Not on file  ? ? ?Tobacco Cessation:  N/A, patient does not currently use tobacco products ? ?Current Medications:  ?Current Facility-Administered Medications  ?Medication Dose Route Frequency Provider Last Rate Last Admin  ? acetaminophen (TYLENOL) tablet 650 mg  650 mg Oral Q6H PRN Briant Cedar, MD      ? alum & mag hydroxide-simeth (MAALOX/MYLANTA) 200-200-20 MG/5ML suspension 30 mL  30 mL Oral Q4H PRN Briant Cedar, MD      ? amLODipine (NORVASC) tablet 2.5 mg  2.5 mg Oral Daily Briant Cedar, MD   2.5 mg at 10/18/21 1003  ? atorvastatin (LIPITOR) tablet 20 mg  20 mg Oral Daily Briant Cedar, MD   20 mg at 10/18/21 1003  ? fluPHENAZine (PROLIXIN) tablet 1 mg  1 mg Oral TID Briant Cedar, MD   1 mg at 10/18/21 2123  ? hydrochlorothiazide (HYDRODIURIL) tablet 12.5 mg  12.5 mg Oral Daily Briant Cedar, MD   12.5 mg at 10/18/21 1003  ? hydrOXYzine (ATARAX) tablet 25 mg  25 mg Oral TID PRN Briant Cedar, MD   25 mg at 10/18/21 2123  ? magnesium hydroxide (MILK OF MAGNESIA) suspension 30 mL  30 mL Oral Daily PRN Briant Cedar, MD      ? montelukast (SINGULAIR) tablet 10 mg  10 mg Oral Daily Briant Cedar, MD   10 mg at 10/18/21 1004  ? OLANZapine zydis (ZYPREXA) disintegrating tablet 5 mg  5 mg Oral Q8H PRN Briant Cedar, MD      ? And  ? ziprasidone (GEODON) injection 20 mg  20 mg Intramuscular PRN Briant Cedar, MD      ? pantoprazole (PROTONIX) EC tablet 40 mg  40 mg Oral BID Briant Cedar, MD   40 mg at 10/18/21 2123  ? propranolol (INDERAL) tablet 40 mg  40 mg Oral Daily Briant Cedar, MD   40 mg at 10/18/21 1003  ? traZODone (DESYREL) tablet 200 mg  200 mg Oral QHS PRN Briant Cedar, MD   200 mg at 10/18/21 2123  ? ?Current Outpatient Medications  ?Medication Sig Dispense Refill  ?  amLODipine (NORVASC) 2.5 MG tablet Take 2.5 mg by mouth daily.    ? atorvastatin (LIPITOR) 20 MG tablet Take 20 mg by mouth daily.    ? hydrochlorothiazide (HYDRODIURIL) 12.5 MG tablet Take 12.5 mg by mouth daily.    ? montelukast (SINGULAIR) 10 MG tablet Take 10 mg by mouth daily.    ? pantoprazole (PROTONIX) 40 MG tablet Take 1 tablet (40 mg total) by mouth 2 (two) times daily. (  Patient taking differently: Take 40 mg by mouth daily.) 90 tablet 3  ? propranolol (INDERAL) 40 MG tablet Take 40 mg by mouth daily.    ? traZODone (DESYREL) 100 MG tablet Take 200 mg by mouth at bedtime as needed for sleep.    ? ? ?PTA Medications: (Not in a hospital admission) ? ? ?Musculoskeletal  ?Strength & Muscle Tone: within normal limits ?Gait & Station:  remains sitting ?Patient leans: N/A ? ?Psychiatric Specialty Exam  ?Presentation  ?General Appearance: Appropriate for Environment; Casual ? ?Eye Contact:Good ? ?Speech:Clear and Coherent ? ?Speech Volume:Normal ? ?Handedness:Right ? ? ?Mood and Affect  ?Mood:Euthymic ? ?Affect:Appropriate; Congruent ? ? ?Thought Process  ?Thought Processes:Goal Directed ? ?Descriptions of Associations:Circumstantial ? ?Orientation:Full (Time, Place and Person) ? ?Thought Content:Logical ? Diagnosis of Schizophrenia or Schizoaffective disorder in past: Yes ? Duration of Psychotic Symptoms: Less than six months ? ? Hallucinations:Hallucinations: None ? ?Ideas of Reference:None ? ?Suicidal Thoughts:Suicidal Thoughts: No ? ?Homicidal Thoughts:Homicidal Thoughts: No ? ? ?Sensorium  ?Memory:Immediate Fair; Recent Fair ? ?Judgment:-- (Improved) ? ?Insight:Shallow ? ? ?Executive Functions  ?Concentration:Fair ? ?Attention Span:Fair ? ?Recall:Fair ? ?[place] ? ?Language:Fair ? ? ?Psychomotor Activity  ?Psychomotor Activity:Psychomotor Activity: Normal ? ? ?Assets  ?Assets:Resilience ? ? ?Sleep  ?Sleep:Sleep: Good ? ? ?No data recorded ? ?Physical Exam  ?Physical Exam ?HENT:  ?   Head:  Normocephalic and atraumatic.  ?Neurological:  ?   Mental Status: She is alert and oriented to person, place, and time.  ? ?Review of Systems  ?Psychiatric/Behavioral:  Negative for hallucinations and suicidal ideas. The

## 2021-10-19 NOTE — ED Notes (Signed)
[name] has remained asleep with no distress or disturbed sleep respirations remain easy. ?

## 2021-10-19 NOTE — ED Notes (Signed)
Pt remains in bed minimal to no interaction with others. Affect is flat mood is avoidant isolative remains in bed. ?

## 2021-10-19 NOTE — Progress Notes (Signed)
Patient has been denied by [facility] and has been faxed out. Patient meets [facility] inpatient criteria per Fatima Sanger, MD. Patient has been faxed out to the following facilities:  ? ?[place]  [address] [place] [address] [phone]  ?[facility]  [address], [place] [address] [phone]  ?[place]  [address] [place] [address] [phone]  ?[place] [place] [address] [phone]  ?[place] [address] [place] [address] [phone]  ?[facility]  [place], [place] [address] [phone]  ?[facility]  [place], [place] [address] [phone]  ?[facility]  [address] [place] [address] [phone]  ?[place] [address] [place] [address] [phone]  ?[facility]  [address], [place] [address] [phone]  ?[facility]  [address] [place] [address] [phone]  ?[facility]  [address] [place] [address] [phone]  ?[facility]  [address] [place] [address] [phone]  ?[place]  [address] [place] [address] [phone]  ?[facility]  [address], [place] [address] [phone]  ?[place]  [place], [place] [address] [phone]  ?[facility]  [address] [place], [place] [address] [phone]  ?[facility]  [address] [place] [address] [phone]  ?[facility]  [address], [place] [address] [phone]  ?[facility]  [address] [place] [address] [phone]  ? ?Mariea Clonts, MSW, LCSW-A  ?11:02 AM 10/19/2021   ?

## 2021-10-19 NOTE — ED Notes (Signed)
Patient's IVC was rescinded by provider. Patient was transported and escorted to [facility] by [employer]. Patient was calm and cooperative prior to leaving. Patient denied SI/HI. Writer spoke to [place] [facility], hospital regarding patient returning to [facility].  ?

## 2021-10-19 NOTE — ED Notes (Signed)
Remains asleep no signs of distress or disturbed  sleep respirations appear easy and skin color appropriate. ?

## 2021-10-28 ENCOUNTER — Encounter: Payer: Self-pay | Admitting: Gastroenterology

## 2021-11-12 IMAGING — CT CT ABD-PELV W/ CM
2 of 5 series · 17 of 46 positions shown, 19 images · IV contrast (APPLIED)
Comparison: None.

CLINICAL DATA: Abdomen pain nausea

EXAM:
CT ABDOMEN AND PELVIS WITH CONTRAST
TECHNIQUE: Multidetector CT imaging of the abdomen and pelvis was performed
using the standard protocol following bolus administration of
intravenous contrast.
CONTRAST:  100mL OMNIPAQUE IOHEXOL 300 MG/ML  SOLN

[Series 3: abdomen 5.0 · axial · 0.81mm/px · z∈[+782,+1162]mm · 14 of 88 slices shown, 16 images]
[im 6/88  soft-tissue]
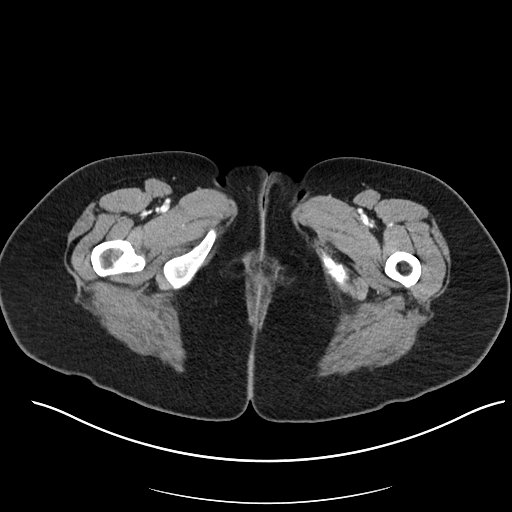
[im 6/88  bone]
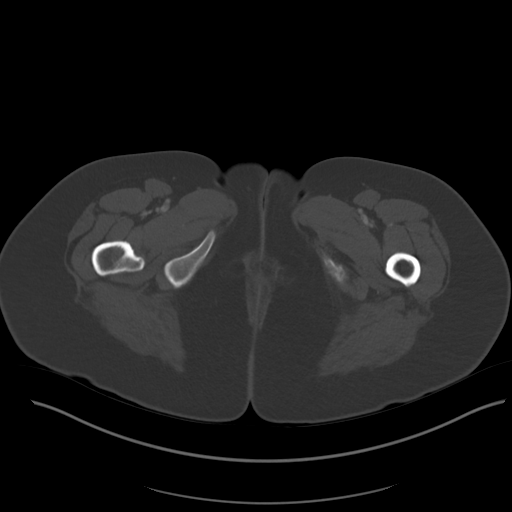
[im 12/88  soft-tissue]
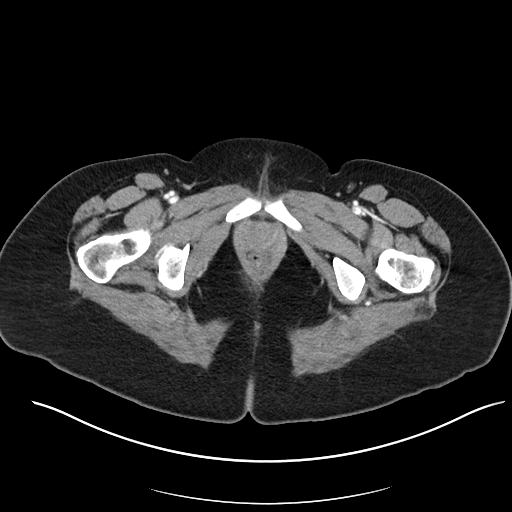
[im 18/88  soft-tissue]
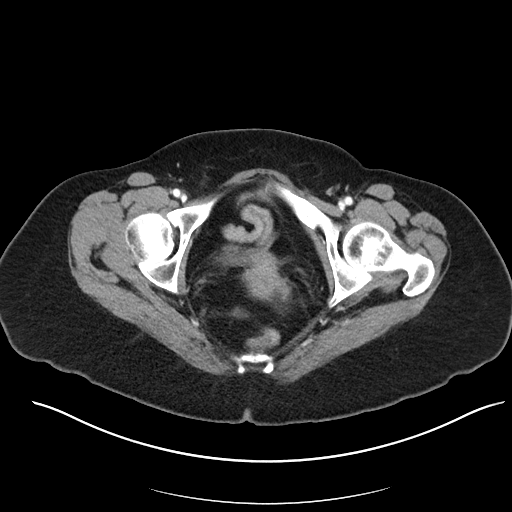
[im 24/88  soft-tissue]
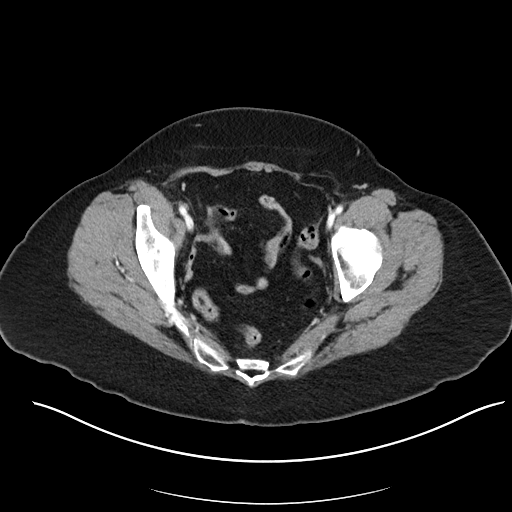
[im 30/88  soft-tissue]
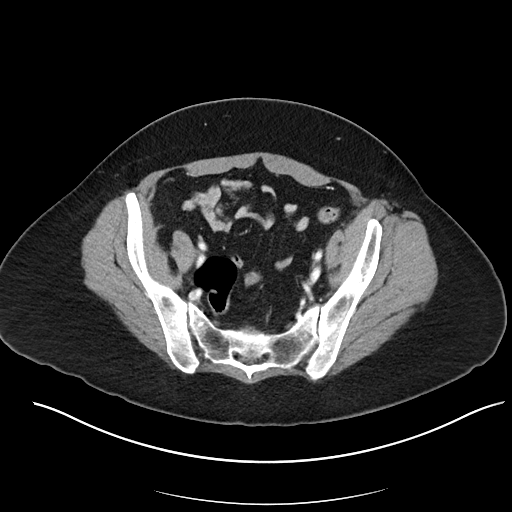
[im 35/88  soft-tissue]
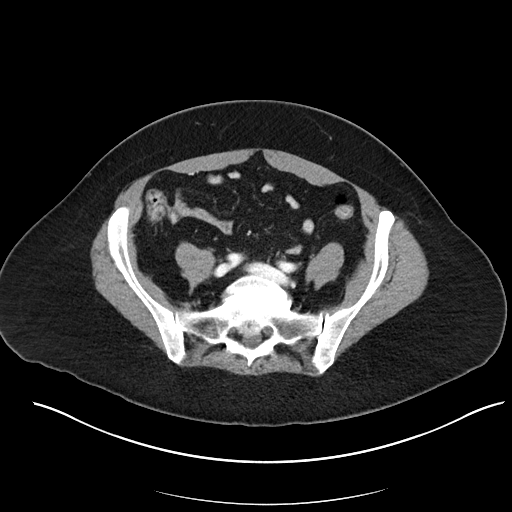
[im 41/88  soft-tissue]
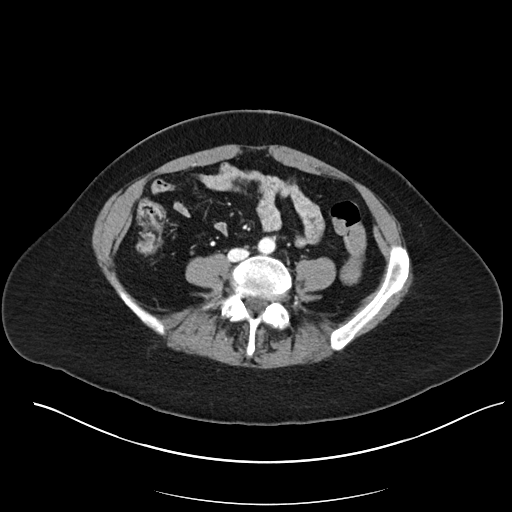
[im 47/88  soft-tissue]
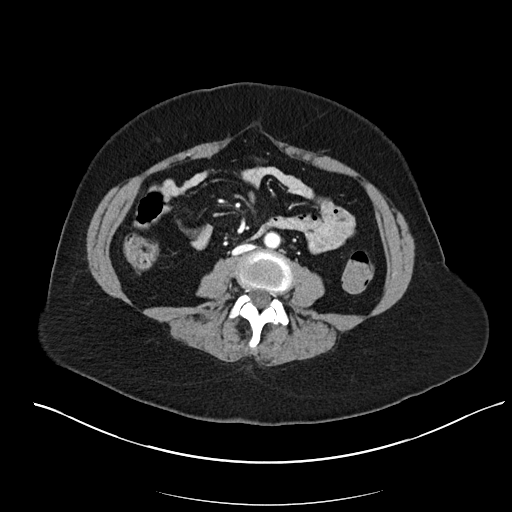
[im 53/88  soft-tissue]
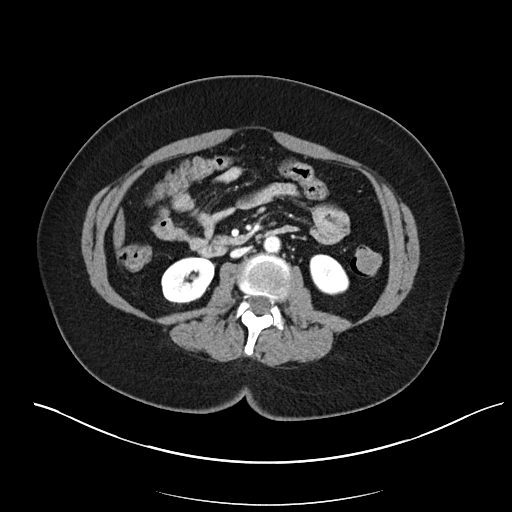
[im 53/88  bone]
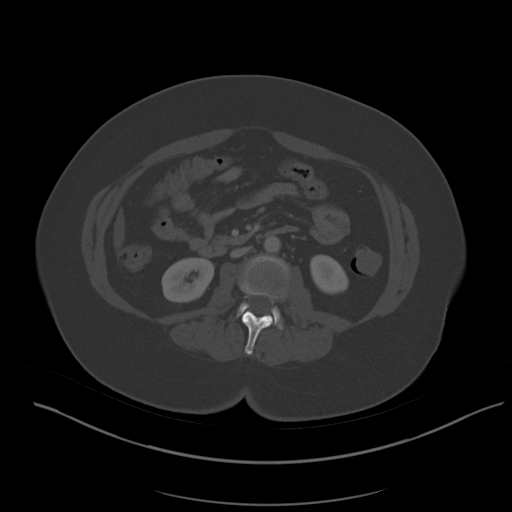
[im 59/88  soft-tissue]
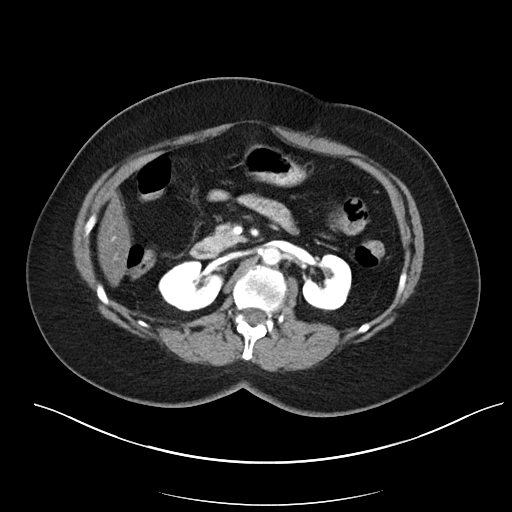
[im 64/88  soft-tissue]
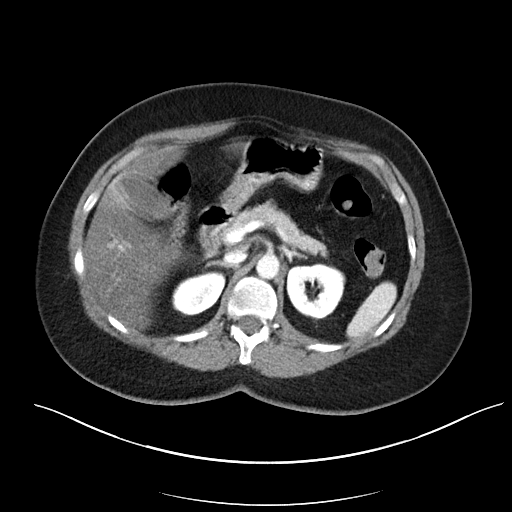
[im 70/88  soft-tissue]
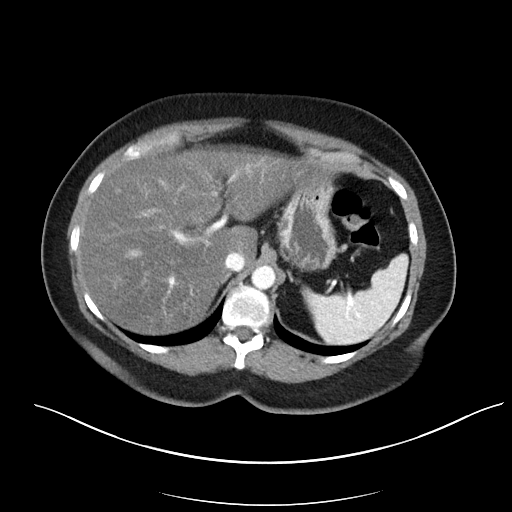
[im 76/88  soft-tissue]
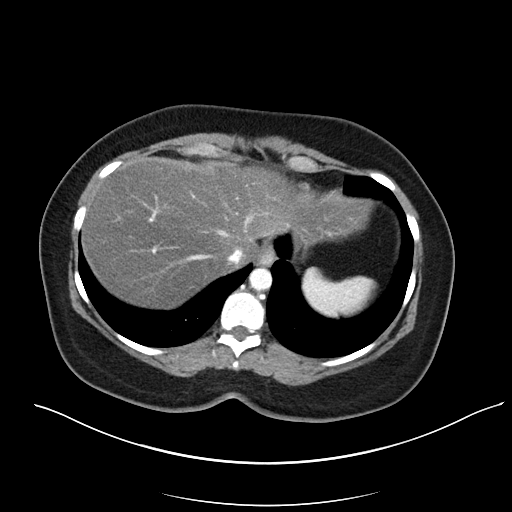
[im 82/88  soft-tissue]
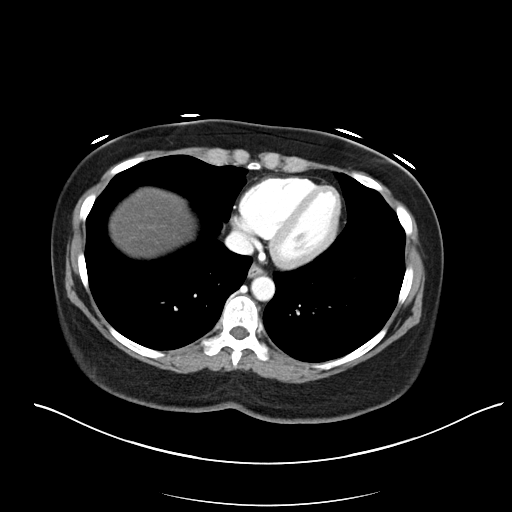

[Series 5: abdomen 3.0 mpr cor · coronal · 0.84mm/px · 3 of 97 slices shown]
[im 33/97  soft-tissue]
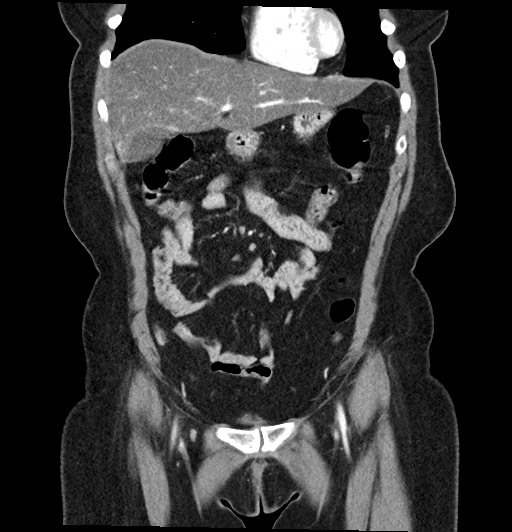
[im 43/97  soft-tissue]
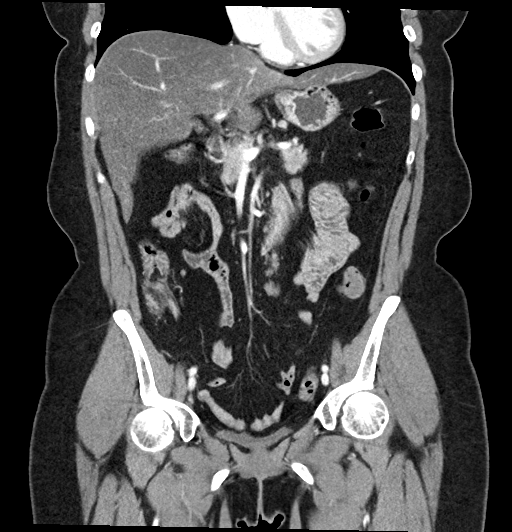
[im 54/97  soft-tissue]
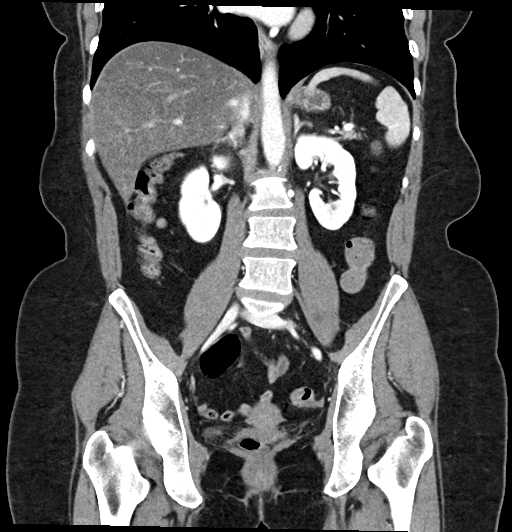

[17 of 46 positions shown; findings below may reference images not displayed]

FINDINGS: Lower chest: No acute abnormality.

Hepatobiliary: Hepatic steatosis with fat sparing near gallbladder
fossa. No calcified stone or biliary dilatation

Pancreas: Unremarkable. No pancreatic ductal dilatation or
surrounding inflammatory changes.

Spleen: Normal in size without focal abnormality.

Adrenals/Urinary Tract: Adrenal glands are unremarkable. Kidneys are
normal, without renal calculi, focal lesion, or hydronephrosis.
Bladder is unremarkable.

Stomach/Bowel: Stomach is within normal limits. Appendix appears
normal. No evidence of bowel wall thickening, distention, or
inflammatory changes.

Vascular/Lymphatic: Mild aortic atherosclerosis. No aneurysm. No
suspicious nodes

Reproductive: Uterus unremarkable. Left tubal ligation clip.
Dislodged right tubal ligation clips visible within the posterior
pelvis near the rectum. No adnexal mass

Other: No abdominal wall hernia or abnormality. No abdominopelvic
ascites.

Musculoskeletal: 6 mm anterolisthesis L5 on S1 with degenerative
changes. No acute osseous abnormality
IMPRESSION: 1. No CT evidence for acute intra-abdominal or pelvic abnormality.
2. Hepatic steatosis with fat sparing near gallbladder fossa
3. Dislodged right tubal ligation clips visible within the posterior
pelvis adjacent to the rectum

## 2021-11-24 ENCOUNTER — Ambulatory Visit (INDEPENDENT_AMBULATORY_CARE_PROVIDER_SITE_OTHER): Payer: Medicare Other | Admitting: Gastroenterology

## 2021-11-24 ENCOUNTER — Encounter: Payer: Self-pay | Admitting: Gastroenterology

## 2021-11-24 VITALS — BP 106/70 | HR 80 | Ht 60.0 in | Wt 174.0 lb

## 2021-11-24 DIAGNOSIS — K219 Gastro-esophageal reflux disease without esophagitis: Secondary | ICD-10-CM

## 2021-11-24 DIAGNOSIS — K5900 Constipation, unspecified: Secondary | ICD-10-CM

## 2021-11-24 NOTE — Progress Notes (Signed)
Referring Provider: Elmore Guise, MD Primary Care Physician:  Elmore Guise, MD  Chief Complaint:  Cough, constipation   IMPRESSION:  Chronic cough with intermittent regurgitation and vomiting. Mild reflux seen on EGD but esophageal biopsies were normal. Differential also includes rumination and esophageal dysmotility. I recommend referral to a tertiary care center.   New onset constipation: Likely related to start Prolixin given temporal association with onset of symptoms. 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 She is not interested in prescription medication for constipation if the change is a side effect from another medication.   History of suspected lactose intolerance with possibility for concurrent food intolerances. I have made it clear that I have limits for diagnosis and testing food intolerances.  Personal history of colon polyps. Surveillance due in 3 years given the size of the larger polyp. She is in recall for a colonoscopy in 2026.     PLAN: - Start Miralax 17 g daily to BID as need for constipation - Continue pantoprazole 40 mg BID - Surveillance colonoscopy 2026 - She declined consultation with an allergist - Change in bowel habits may be related to Prolixin - I've asked her to speak with the prescribing doctor as she does not want to add additional medications to treat side effects - Encouraged her to seek consultation at an academic center given her chronic symptoms   HPI: [name] is a 56 y.o. female who returns in follow-up for chronic cough and reflux. See her consult note from 08/30/21 and procedure notes from 10/11/21 for complete details.   At the time of consultation 08/30/21 she reported: coughing, vomiting without nausea, loose stools, fecal soiling, diarrhea, and chest pain.  She has a history of lactose intolerance, chronic headaches, depression, hypercholesterolemia,  hypertension and schizoaffective disorder.  She had PEG 16 years ago, but she doesn't know why.  She has been in assisted living for 15 years. She has a guardian.   Saw a gastroenterologist in her early 61s for abdominal pain. She had a colonoscopy in her 23s or mid 40 to evaluate constipation that in retrospect she attributes to long-term use of Resperidol. During that time her stools were very wide caliber and uncomfortable. She has treated with Milk of Magnesia after lack of response to Miralax.    Over the last 5 years she had had lactose intolerance with diarrhea, gas and fecal soiling occurring with any dairy exposure or medications that have lactose in their ingredients.  Stool consistency ranges from soft and mushy to watery diarrhea.  She has 1-3 bowel movements daily followed by the passage of multiple mucousy stools.  Switching to dairy free and using coconut milk improved her symptoms until recently.   Now she finds that coconut milk also causes her symptoms. No blood in the stools.  She is bothered by seeing undigested food in her stool when she feels she was able to digest these in the past.  Following an elimination diet for 3 years she has also been able to identify several other foods that she feels may be food allergies or intolerances in addition to dairy including whey, orange soda, lactose-free milk with lactase, Kason, coconut oil, corn syrup solids, nondairy creamer, hazelnut with results of loose stools, gas, coughing, and sensation of a throat/chest stricture.  She notes being able to eat milk and cereal as long as she does not drink it, dry coconut baked into cookies, peanuts and  peanut butter, most snack sized candy bars including those with chocolate, and chocolate cake with sugar icing.  Several foods have caused a rash or blisters including fresh pineapple, grapefruit, orange juice, raw green peppers, vinegar such as salt and vinegar potato chips, ascorbic acid, citric acid,  excessive coffee, fresh lemon and lime.  She has had similar reactions to sulfa drugs, valproic acid including Depakote, and multivitamin with vitamin C.  She has used Famvir and Zovirax for the blisters but feels that the lactose in those medications causes the associated severe diarrhea.  She has a chronic cough and intermittent vomiting and regurgitation.  She worries the constant coughing has caused a stricture in her throat and chest due to intermittent wheezing that has occurred 4 times.  She denies reflux or heartburn symptoms. The new doctor at her assisted living facility thinks it's GERD but she hasn't agreed with this because she doesn't have acid symptoms.  No improvement with Protonix 40 mg taken appropriately every morning. Did not tolerate omeprazole given her lactose intolerance. No odynophagia, neck pain, sore throat, dysphonia.  However she sometimes feels like water struggles to pass bile food bolus.  Additional concerns included a sensitive gag reflex, watery nose while eating, postnasal drip after eating, frequent earache, sinus "swelling" at night where she wakes up with difficulty breathing.  Labs from 08/09/2021 showing normal comprehensive metabolic panel with a hemoglobin of 12.8, MCV 90.2, RDW 12.4, platelets 201.  She also has a normal hepatic function panel.  Colonoscopy 10/11/2021 revealed a 12 mm transverse colon polyp and a 3 mm cecal polyp.  The cecal polyp was a tubular adenoma the transverse polyp was a serrated mucosal polyp.  EGD 10/11/2021 showed LA grade a reflux esophagitis.  Pantoprazole was increased to 40 mg twice daily.  Esophageal biopsies were normal.  Gastric biopsies were normal.  She returns 11/24/21 in follow-up. She continues to having a cough despite pantoprazole. Overall she is not coughing as much as she was, but, she is having some clear mucous especially when she brushes her teeth. She feels like it's due to an involuntary contraction of her abdominal  muscles.   She is also reporting a bowel movement every 4-5 days. This is in the setting of prior diarrhea. She has had some medication changes including the addition of hydroxyzine and prolixin.    Constipated for 10 years starting 15 years ago. Having a bowel movement every 6 weeks during that time requiring manual assistance with defecation. Previously treated with Milk of Magnesia QAM titrating the dose as needed. While on Resperidol her constipation was well controlled.  She has also used Miralax and Senekot but finds they provide incomplete relief. She did not find lactulose helpful.    Past Medical History:  Diagnosis Date   Back pain    Depression    GERD (gastroesophageal reflux disease)    High cholesterol    Schizoaffective disorder ([place])     Past Surgical History:  Procedure Laterality Date   COLONOSCOPY     GASTROSTOMY W/ FEEDING TUBE     16 yrs ago   MM BREAST STEREO BIOPSY LEFT ([facility] HX)     benign fibroid   TONSILLECTOMY AND ADENOIDECTOMY     as a child   UPPER GASTROINTESTINAL ENDOSCOPY       Current Outpatient Medications  Medication Sig Dispense Refill   amLODipine (NORVASC) 2.5 MG tablet Take 2.5 mg by mouth daily.     atorvastatin (LIPITOR) 20 MG tablet Take  20 mg by mouth daily.     fluPHENAZine (PROLIXIN) 1 MG tablet Take 1 tablet (1 mg total) by mouth 3 (three) times daily. 90 tablet 0   hydrochlorothiazide (HYDRODIURIL) 12.5 MG tablet Take 12.5 mg by mouth daily.     hydrOXYzine (ATARAX) 25 MG tablet Take 1 tablet (25 mg total) by mouth 3 (three) times daily as needed for anxiety. 30 tablet 0   pantoprazole (PROTONIX) 40 MG tablet Take 1 tablet (40 mg total) by mouth 2 (two) times daily. (Patient taking differently: Take 40 mg by mouth daily.) 90 tablet 3   propranolol (INDERAL) 40 MG tablet Take 40 mg by mouth daily.     traZODone (DESYREL) 100 MG tablet Take 200 mg by mouth at bedtime as needed for sleep.     No current facility-administered  medications for this visit.    Allergies as of 11/24/2021 - Review Complete 11/24/2021  Allergen Reaction Noted   Ascorbic acid & derivatives Other (See Comments) 01/27/2021   Citrus Other (See Comments) 01/27/2021   Luvox [fluvoxamine] Other (See Comments) 02/23/2019   Other Other (See Comments) 02/23/2019   Sulfa antibiotics Other (See Comments) 05/06/2012   Sulfate  08/30/2021    Family History  Problem Relation Age of Onset   Esophageal cancer Neg Hx    Pancreatic cancer Neg Hx    Liver disease Neg Hx    Rectal cancer Neg Hx    Colon cancer Neg Hx       Physical Exam: General:   Alert,  well-nourished, pleasant and cooperative in NAD Head:  Normocephalic and atraumatic. Eyes:  Sclera clear, no icterus.   Conjunctiva pink.heezes. Heart:  Regular rate and rhythm; no murmurs. Abdomen:  Soft, nontender, nondistended, normal bowel sounds, no rebound or guarding. No hepatosplenomegaly.   Neurologic:  Alert and  oriented x4;  grossly nonfocal Skin:  Intact without significant lesions or rashes. Psych:  Alert and cooperative. Normal mood and affect.    Shinika Estelle L. Tarri Glenn, MD, MPH 11/24/2021, 11:42 AM

## 2021-11-24 NOTE — Patient Instructions (Addendum)
It was my pleasure to provide care to you today. Based on our discussion, I am providing you with my recommendations below:  RECOMMENDATION(S):   - Start Miralax 17 g daily - Continue pantoprazole 40 mg -Twice daily  - Surveillance colonoscopy 2026 - Recommendations for consultation with an allergist were given.  - Encouraged you to seek consultation at an academic center -Referral has been placed to [facility] -Gastroenterology.    BMI:  If you are age 55 or older, your body mass index should be between 23-30. Your Body mass index is 33.98 kg/m. If this is out of the aforementioned range listed, please consider follow up with your Primary Care Provider.  If you are age 26 or younger, your body mass index should be between 19-25. Your Body mass index is 33.98 kg/m. If this is out of the aformentioned range listed, please consider follow up with your Primary Care Provider.   MY CHART:  The [place] [facility] providers would like to encourage you to use [facility] to communicate with providers for non-urgent requests or questions.  Due to long hold times on the telephone, sending your provider a message by [facility] may be a faster and more efficient way to get a response.  Please allow 48 business hours for a response.  Please remember that this is for non-urgent requests.   Thank you for trusting me with your gastrointestinal care!    Thornton Park, MD, MPH

## 2022-02-28 ENCOUNTER — Telehealth: Payer: Self-pay | Admitting: *Deleted

## 2022-02-28 NOTE — Patient Outreach (Signed)
  Care Coordination   02/28/2022 Name: [name] MRN: [id number] DOB: May 09, 1966   Care Coordination Outreach Attempts:  An unsuccessful telephone outreach was attempted today to offer the patient information about available care coordination services as a benefit of their health plan.   Follow Up Plan:  Additional outreach attempts will be made to offer the patient care coordination information and services.   Encounter Outcome:  No Answer  Care Coordination Interventions Activated:  No   Care Coordination Interventions:  No, not indicated    Jacqlyn Larsen [facility], [place] RN Care Coordinator [phone]

## 2022-03-01 ENCOUNTER — Telehealth: Payer: Self-pay | Admitting: *Deleted

## 2022-03-01 NOTE — Patient Outreach (Signed)
  Care Coordination   Initial Visit Note   03/01/2022 Name: [name] MRN: [id number] DOB: May 04, 1966  [name] is a 56 y.o. year old female who sees Demarchi, Satira Anis, MD for primary care. I spoke with   [name] by phone today.  What matters to the patients health and wellness today?  Verified patient is in [place]   None     SDOH assessments and interventions completed:  No     Care Coordination Interventions Activated:  No  Care Coordination Interventions:  No, not indicated   Follow up plan: No further intervention required.   Encounter Outcome:  Pt. Visit Completed   Jacqlyn Larsen [facility], BSN [facility] RN Care Coordinator [phone]

## 2022-04-13 NOTE — Progress Notes (Unsigned)
Cardiology Office Note   Date:  04/14/2022   ID:  [name], DOB 11/09/1965, MRN [id number]  PCP:  Elmore Guise, MD  Cardiologist:   None Referring:  Gelene Mink, FNP  Chief Complaint  Patient presents with   Chest Pain      History of Present Illness: [name] is a 56 y.o. female who is referred by Gelene Mink, FNP for evaluation of shortness of breath and palpitations.  The patient states she has a sensation that when she is trying to exhale she has to inhale in the middle of it and she cannot fully get her breath.  To sensation seems to collide.  Causes squeezing her questions feeling. Happens walking down the hallway or lying down at night.  She sits up to breathe.  She feels her heart racing when this happens as well.  Says its been going on for quite some time.  She does do some walking around the facility where she lives and she cannot necessarily bring this on.  She has not had any other cardiovascular testing.  She was on propranolol makes.  She does have a blood pressure that is well.  She has some dyslipidemia well managed.  She lives at [facility] in independent living.  I was able to review some outside records.  She did have an echocardiogram done through the facility and I see a report that says everything is normal with a well-preserved ejection fraction.  She has had some electrolytes which have been unremarkable.  TSH was normal in May.   Past Medical History:  Diagnosis Date   Back pain    Depression    GERD (gastroesophageal reflux disease)    High cholesterol    Schizoaffective disorder ([facility])     Past Surgical History:  Procedure Laterality Date   COLONOSCOPY     GASTROSTOMY W/ FEEDING TUBE     16 yrs ago   Hypertension     MM BREAST STEREO BIOPSY LEFT ([facility] HX)     benign fibroid   TONSILLECTOMY AND ADENOIDECTOMY     as a child   UPPER GASTROINTESTINAL ENDOSCOPY       Current Outpatient Medications  Medication Sig  Dispense Refill   amLODipine (NORVASC) 2.5 MG tablet Take 2.5 mg by mouth daily.     atorvastatin (LIPITOR) 20 MG tablet Take 20 mg by mouth daily.     fluPHENAZine (PROLIXIN) 1 MG tablet Take 1 tablet (1 mg total) by mouth 3 (three) times daily. 90 tablet 0   hydrochlorothiazide (HYDRODIURIL) 12.5 MG tablet Take 12.5 mg by mouth daily.     hydrOXYzine (ATARAX) 25 MG tablet Take 1 tablet (25 mg total) by mouth 3 (three) times daily as needed for anxiety. 30 tablet 0   lamoTRIgine (LAMICTAL) 200 MG tablet Take 200 mg by mouth daily.     pantoprazole (PROTONIX) 40 MG tablet Take 1 tablet (40 mg total) by mouth 2 (two) times daily. (Patient taking differently: Take 40 mg by mouth daily.) 90 tablet 3   traZODone (DESYREL) 100 MG tablet Take 200 mg by mouth at bedtime as needed for sleep.     No current facility-administered medications for this visit.    Allergies:   Ascorbic acid & derivatives, Citrus, Luvox [fluvoxamine], Other, Sulfa antibiotics, and Sulfate    Social History:  The patient  reports that she has never smoked. She has never been exposed to tobacco smoke. She has never used smokeless  tobacco. She reports that she does not currently use alcohol. She reports that she does not use drugs.   Family History:  The patient's family history is not on file.    ROS:  Please see the history of present illness.   Otherwise, review of systems are positive for none.   All other systems are reviewed and negative.    PHYSICAL EXAM: VS:  BP 118/72 (BP Location: Left Arm, Patient Position: Sitting, Cuff Size: Normal)   Pulse 69   Ht 5' (1.524 m)   Wt 158 lb 3.2 oz (71.8 kg)   LMP 04/23/2012   SpO2 97%   BMI 30.90 kg/m  , BMI Body mass index is 30.9 kg/m. GENERAL:  Well appearing HEENT:  Pupils equal round and reactive, fundi not visualized, oral mucosa unremarkable NECK:  No jugular venous distention, waveform within normal limits, carotid upstroke brisk and symmetric, no bruits, no  thyromegaly LYMPHATICS:  No cervical, inguinal adenopathy LUNGS:  Clear to auscultation bilaterally BACK:  No CVA tenderness CHEST:  Unremarkable HEART:  PMI not displaced or sustained,S1 and S2 within normal limits, no S3, no S4, no clicks, no rubs, no murmurs ABD:  Flat, positive bowel sounds normal in frequency in pitch, no bruits, no rebound, no guarding, no midline pulsatile mass, no hepatomegaly, no splenomegaly EXT:  2 plus pulses throughout, no edema, no cyanosis no clubbing SKIN:  No rashes no nodules NEURO:  Cranial nerves II through XII grossly intact, motor grossly intact throughout PSYCH:  Cognitively intact, oriented to person place and time    EKG:  EKG is ordered today. The ekg ordered today demonstrates sinus rhythm, rate 69, axis within normal limits, borderline QT prolongation, nonspecific T wave changes.   Recent Labs: 10/15/2021: ALT 51; BUN 15; Creatinine, Ser 1.17; Hemoglobin 14.2; Platelets 213; Sodium 140; TSH 2.285 10/17/2021: Potassium 3.5    Lipid Panel    Component Value Date/Time   CHOL 149 10/15/2021 1555   TRIG 190 (H) 10/15/2021 1555   HDL 45 10/15/2021 1555   CHOLHDL 3.3 10/15/2021 1555   VLDL 38 10/15/2021 1555   LDLCALC 66 10/15/2021 1555      Wt Readings from Last 3 Encounters:  04/14/22 158 lb 3.2 oz (71.8 kg)  11/24/21 174 lb (78.9 kg)  10/11/21 173 lb (78.5 kg)      Other studies Reviewed: Additional studies/ records that were reviewed today include: Labs , echo results . Review of the above records demonstrates:  Please see elsewhere in the note.     ASSESSMENT AND PLAN:  Tachycardia: The patient describes some vague palpitations.  I am going to apply a 2-week monitor.  Electrolytes are unremarkable.   Dyslipidemia:   She has an excellent LDL.  Triglycerides are slightly elevated.Dietary management should focus on attaining and maintaining a healthy weight and reduction of intake of simple carbohydrates, (<6 percent calories of  added sugar and 30 to 35 percent calories of total fat.)  Dietary fat is not a primary source for liver TG, and higher-fat diets do not raise fasting plasma TG levels in most people. Nevertheless, a change in the types of fat (ie, reducing saturated versus poly- and monounsaturated fats) is recommended . I suggest increased consumption of fish that contain high amounts of omega-3 fatty acids (baked not fried.)    SOB: Given that some of her symptoms are reproducible with exercise I will bring her back for a POET (Plain Old Exercise Treadmill).  She does have follow-up with  a pulmonologist as well.  If her treadmill test is unremarkable along with a monitor then no further cardiovascular testing would be suggested.  I do not strongly suspect that this is a cardiac etiology.   Current medicines are reviewed at length with the patient today.  The patient does not have concerns regarding medicines.  The following changes have been made:  no change  Labs/ tests ordered today include:   Orders Placed This Encounter  Procedures   Exercise Tolerance Test   LONG TERM MONITOR (3-14 DAYS)   EKG 12-Lead     Disposition:   FU with me as needed based on the results of the above.   Signed, Minus Breeding, MD  04/14/2022 2:04 PM    [place]

## 2022-04-14 ENCOUNTER — Ambulatory Visit (INDEPENDENT_AMBULATORY_CARE_PROVIDER_SITE_OTHER): Payer: Medicare Other

## 2022-04-14 ENCOUNTER — Encounter: Payer: Self-pay | Admitting: Cardiology

## 2022-04-14 ENCOUNTER — Ambulatory Visit: Payer: Medicare Other | Attending: Cardiology | Admitting: Cardiology

## 2022-04-14 ENCOUNTER — Encounter: Payer: Self-pay | Admitting: *Deleted

## 2022-04-14 VITALS — BP 118/72 | HR 69 | Ht 60.0 in | Wt 158.2 lb

## 2022-04-14 DIAGNOSIS — R0602 Shortness of breath: Secondary | ICD-10-CM | POA: Diagnosis not present

## 2022-04-14 DIAGNOSIS — R002 Palpitations: Secondary | ICD-10-CM

## 2022-04-14 NOTE — Progress Notes (Unsigned)
Enrolled for [employer] to mail a ZIO XT long term holter monitor to the patients address on file.   Letter with instructions mailed to patient.

## 2022-04-14 NOTE — Patient Instructions (Signed)
Medication Instructions:  Continue same medications   Lab Work: None ordered   Testing/Procedures: Schedule  Treadmill  ( POET )  14 day heart monitor  will be mailed to your home   Follow-Up: At [facility], you and your health needs are our priority.  As part of our continuing mission to provide you with exceptional heart care, we have created designated Provider Care Teams.  These Care Teams include your primary Cardiologist (physician) and Advanced Practice Providers (APPs -  Physician Assistants and Nurse Practitioners) who all work together to provide you with the care you need, when you need it.  We recommend signing up for the patient portal called "MyChart".  Sign up information is provided on this After Visit Summary.  MyChart is used to connect with patients for Virtual Visits (Telemedicine).  Patients are able to view lab/test results, encounter notes, upcoming appointments, etc.  Non-urgent messages can be sent to your provider as well.   To learn more about what you can do with MyChart, go to [email or web address].    Your next appointment: As Needed     The format for your next appointment: Office     Provider:  Dr.Hochrein    Important Information About Sugar

## 2022-04-19 DIAGNOSIS — R002 Palpitations: Secondary | ICD-10-CM | POA: Diagnosis not present

## 2022-04-19 DIAGNOSIS — R0602 Shortness of breath: Secondary | ICD-10-CM | POA: Diagnosis not present

## 2022-04-21 ENCOUNTER — Encounter: Payer: Self-pay | Admitting: Pulmonary Disease

## 2022-04-21 ENCOUNTER — Ambulatory Visit (INDEPENDENT_AMBULATORY_CARE_PROVIDER_SITE_OTHER): Payer: Medicare Other | Admitting: Pulmonary Disease

## 2022-04-21 ENCOUNTER — Ambulatory Visit (INDEPENDENT_AMBULATORY_CARE_PROVIDER_SITE_OTHER): Payer: Medicare Other

## 2022-04-21 VITALS — BP 120/72 | HR 60 | Temp 98.3°F | Ht 60.0 in | Wt 161.2 lb

## 2022-04-21 DIAGNOSIS — R0609 Other forms of dyspnea: Secondary | ICD-10-CM | POA: Diagnosis not present

## 2022-04-21 MED ORDER — BUDESONIDE-FORMOTEROL FUMARATE 160-4.5 MCG/ACT IN AERO: 2.0000 | INHALATION_SPRAY | Freq: Two times a day (BID) | RESPIRATORY_TRACT | 12 refills | Status: DC

## 2022-04-25 NOTE — Progress Notes (Signed)
CXR is clear

## 2022-04-26 ENCOUNTER — Telehealth (HOSPITAL_COMMUNITY): Payer: Self-pay | Admitting: *Deleted

## 2022-04-26 NOTE — Telephone Encounter (Signed)
Left message on cell phone per DPR outlining instructions for upcoming stress test on 05/02/22 at 3:30.

## 2022-05-02 ENCOUNTER — Ambulatory Visit (HOSPITAL_COMMUNITY): Payer: Medicare Other | Attending: Cardiology

## 2022-05-02 ENCOUNTER — Encounter: Payer: Self-pay | Admitting: Pulmonary Disease

## 2022-05-02 DIAGNOSIS — R002 Palpitations: Secondary | ICD-10-CM | POA: Diagnosis present

## 2022-05-02 DIAGNOSIS — R0602 Shortness of breath: Secondary | ICD-10-CM | POA: Insufficient documentation

## 2022-05-02 LAB — EXERCISE TOLERANCE TEST
Estimated workload: 5.2
Exercise duration (min): 3 min
Exercise duration (sec): 34 s
MPHR: 165 {beats}/min
Peak HR: 144 {beats}/min
Percent HR: 87 %
Rest HR: 71 {beats}/min
ST Depression (mm): 0 mm

## 2022-05-02 NOTE — Progress Notes (Signed)
$'@Patient'u$  ID: [name], female    DOB: [birth year], 56 y.o.   MRN: [id number]  Chief Complaint  Patient presents with   Consult    SOB, cough pt states cold air makes it worse. Dry cough. About 55 years     Referring provider: Elmore Guise, MD  HPI:   56 y.o. woman whom are seen in consultation for evaluation of chronic dry cough and dyspnea exertion.  Multiple ED notes 10/2021 reviewed.  Patient notes shortness of breath for about 2 years.  Worse on inclines or stairs.  Occasionally can occur on flat surfaces as well.  Not really an issue at rest.  Unclear what interventions have been tried.  No seasonal or environmental factors that she can identify that make things better or worse.  No position that makes things better or worse.  No time of day when things are better or worse.  No other alleviating or exacerbating factors.  She also reports chronic cough.  Around the same timeline if not longer of the shortness of breath.  Dry.  She has GERD but denies breakthrough GERD symptoms or need for as needed antiacids.  No times a day with things better or worse.  No position make things better or worse.  No seasonal environmental factors she can identify that make things better or worse.  No other alleviating or exacerbating factors.  Most recent chest x-ray, chest imaging remote, 07/23/2008, personally reviewed and interpreted as clear lungs bilaterally.  Chest x-ray 07/22/2008 personally reviewed with similar findings but with right lower lobe nodular opacity that is no longer present on the 07/23/2008 chest x-ray.  PMH: Hypertension, hyperlipidemia, GERD Surgical history: Tonsillectomy, adenoidectomy, breast biopsy, remote history G-tube now removed Family history: Denies significant respiratory illness in first-degree relatives Social history: Never smoker, lives in [place] / Pulmonary Flowsheets:   ACT:      No data to display          MMRC:     No data  to display          Epworth:      No data to display          Tests:   FENO:  No results found for: "NITRICOXIDE"  PFT:     No data to display          WALK:      No data to display          Imaging: Personally reviewed and as per EMR discussion in this note  Lab Results: Personally reviewed CBC    Component Value Date/Time   WBC 6.8 10/15/2021 1555   RBC 4.75 10/15/2021 1555   HGB 14.2 10/15/2021 1555   HCT 40.8 10/15/2021 1555   PLT 213 10/15/2021 1555   MCV 85.9 10/15/2021 1555   MCH 29.9 10/15/2021 1555   MCHC 34.8 10/15/2021 1555   RDW 12.1 10/15/2021 1555   LYMPHSABS 1.6 10/15/2021 1555   MONOABS 0.4 10/15/2021 1555   EOSABS 0.2 10/15/2021 1555   BASOSABS 0.0 10/15/2021 1555    BMET    Component Value Date/Time   NA 140 10/15/2021 1555   K 3.5 10/17/2021 1520   CL 104 10/15/2021 1555   CO2 28 10/15/2021 1555   GLUCOSE 87 10/15/2021 1555   BUN 15 10/15/2021 1555   CREATININE 1.17 (H) 10/15/2021 1555   CALCIUM 9.6 10/15/2021 1555   CALCIUM 7.1 (L) 08/05/2008 0345   GFRNONAA 55 (L) 10/15/2021 1555  GFRAA >60 04/18/2019 0339    BNP No results found for: "BNP"  ProBNP No results found for: "PROBNP"  Specialty Problems   None   Allergies  Allergen Reactions   Ascorbic Acid & Derivatives Other (See Comments)    Caused blisters in the mouth and inside other places of the body   Citrus Other (See Comments)    Citrus/orange juice: Caused blisters in the mouth and inside other places of the body   Luvox [Fluvoxamine] Other (See Comments)    Agitation, irritability, and anger   Other Other (See Comments)    (Citrus, ascorbic acid, and orange juice) Blisters in mouth and inside other places of the body.    Sulfa Antibiotics Other (See Comments)    Not sure of allergy but happened when she used a topical sulfa cream.   Sulfate     Any medication containing sulfa    Immunization History  Administered Date(s) Administered    Influenza Split 05/06/2012    Past Medical History:  Diagnosis Date   Back pain    Depression    GERD (gastroesophageal reflux disease)    High cholesterol    Schizoaffective disorder (HCC)     Tobacco History: Social History   Tobacco Use  Smoking Status Never   Passive exposure: Never  Smokeless Tobacco Never   Counseling given: Not Answered   Continue to not smoke  Outpatient Encounter Medications as of 04/21/2022  Medication Sig   budesonide-formoterol (SYMBICORT) 160-4.5 MCG/ACT inhaler Inhale 2 puffs into the lungs 2 (two) times daily.   amLODipine (NORVASC) 2.5 MG tablet Take 2.5 mg by mouth daily.   atorvastatin (LIPITOR) 20 MG tablet Take 20 mg by mouth daily.   fluPHENAZine (PROLIXIN) 1 MG tablet Take 1 tablet (1 mg total) by mouth 3 (three) times daily.   hydrochlorothiazide (HYDRODIURIL) 12.5 MG tablet Take 12.5 mg by mouth daily.   hydrOXYzine (ATARAX) 25 MG tablet Take 1 tablet (25 mg total) by mouth 3 (three) times daily as needed for anxiety.   lamoTRIgine (LAMICTAL) 200 MG tablet Take 200 mg by mouth daily.   pantoprazole (PROTONIX) 40 MG tablet Take 1 tablet (40 mg total) by mouth 2 (two) times daily. (Patient taking differently: Take 40 mg by mouth daily.)   traZODone (DESYREL) 100 MG tablet Take 200 mg by mouth at bedtime as needed for sleep.   No facility-administered encounter medications on file as of 04/21/2022.     Review of Systems  Review of Systems  No chest pain with exertion.  No orthopnea or PND.  Comprehensive review of systems otherwise negative. Physical Exam  BP 120/72 (BP Location: Left Arm, Cuff Size: Normal)   Pulse 60   Temp 98.3 F (36.8 C) (Oral)   Ht 5' (1.524 m)   Wt 161 lb 3.2 oz (73.1 kg)   LMP 04/23/2012   SpO2 97%   BMI 31.48 kg/m   Wt Readings from Last 5 Encounters:  04/21/22 161 lb 3.2 oz (73.1 kg)  04/14/22 158 lb 3.2 oz (71.8 kg)  11/24/21 174 lb (78.9 kg)  10/11/21 173 lb (78.5 kg)  08/30/21 173 lb  9.6 oz (78.7 kg)    BMI Readings from Last 5 Encounters:  04/21/22 31.48 kg/m  04/14/22 30.90 kg/m  11/24/21 33.98 kg/m  10/11/21 33.79 kg/m  08/30/21 33.90 kg/m     Physical Exam General: Sitting in chair, in no acute distress Eyes: EOMI, no icterus Neck: Supple, no JVP Pulmonary: Clear, normal work of  breathing Cardiovascular: Warm, no edema Abdomen: Nondistended, bowel sounds present MSK: No synovitis, no joint effusion Neuro: Normal gait, no weakness Psych: Normal mood, flat affect   Assessment & Plan:   Dyspnea on exertion: With associated cough.  High suspicion for asthma.  Further evaluation with PFT.  Chronic cough: Dry, present for 2 years, associated with dyspnea on exertion.  Possible GERD related, silent reflux at night given dry cough.  No recent chest imaging I can review.  Chest x-ray today for further evaluation.  High suspicion for asthma in addition to GERD.  Asthma: Clinical diagnosis based on dyspnea exertion and cough.  Trial high-dose Symbicort 2 puffs twice daily.  Assess response to symptoms above.   Return in about 3 months (around 07/22/2022).   Lanier Clam, MD 04/21/2022

## 2022-05-09 ENCOUNTER — Encounter: Payer: Self-pay | Admitting: *Deleted

## 2022-05-16 ENCOUNTER — Encounter: Payer: Self-pay | Admitting: *Deleted

## 2022-07-29 ENCOUNTER — Telehealth: Payer: Self-pay | Admitting: Cardiology

## 2022-07-29 NOTE — Telephone Encounter (Signed)
Returned call to patient-number provided is to [facility] (where patient resides).   Spoke to nurse.  She advised she would speak to patient and make her aware.

## 2022-07-29 NOTE — Telephone Encounter (Signed)
Follow Up:    Patient said she never received her Stress Test and Monitor results from  November 2023.[name] She said she would like to know those results please.

## 2022-09-09 ENCOUNTER — Ambulatory Visit (INDEPENDENT_AMBULATORY_CARE_PROVIDER_SITE_OTHER): Payer: Medicare Other | Admitting: Pulmonary Disease

## 2022-09-09 DIAGNOSIS — R0609 Other forms of dyspnea: Secondary | ICD-10-CM

## 2022-09-09 LAB — PULMONARY FUNCTION TEST
DL/VA % pred: 117 %
DL/VA: 5.14 ml/min/mmHg/L
DLCO cor % pred: 115 %
DLCO cor: 20.74 ml/min/mmHg
DLCO unc % pred: 115 %
DLCO unc: 20.74 ml/min/mmHg
FEF 25-75 Post: 0.73 L/sec
FEF 25-75 Pre: 1.15 L/sec
FEF2575-%Change-Post: -36 %
FEF2575-%Pred-Post: 31 %
FEF2575-%Pred-Pre: 49 %
FEV1-%Change-Post: -26 %
FEV1-%Pred-Post: 47 %
FEV1-%Pred-Pre: 64 %
FEV1-Post: 1.09 L
FEV1-Pre: 1.48 L
FEV1FVC-%Change-Post: -19 %
FEV1FVC-%Pred-Pre: 66 %
FEV6-%Change-Post: -6 %
FEV6-%Pred-Post: 87 %
FEV6-%Pred-Pre: 93 %
FEV6-Post: 2.5 L
FEV6-Pre: 2.68 L
FEV6FVC-%Pred-Post: 103 %
FEV6FVC-%Pred-Pre: 103 %
FVC-%Change-Post: -8 %
FVC-%Pred-Post: 87 %
FVC-%Pred-Pre: 95 %
FVC-Post: 2.59 L
FVC-Pre: 2.82 L
Post FEV1/FVC ratio: 42 %
Post FEV6/FVC ratio: 100 %
Pre FEV1/FVC ratio: 53 %
Pre FEV6/FVC Ratio: 100 %
RV % pred: 86 %
RV: 1.46 L
TLC % pred: 97 %
TLC: 4.35 L

## 2022-09-09 NOTE — Patient Instructions (Signed)
Full PFT performed today. °

## 2022-09-09 NOTE — Progress Notes (Signed)
Full PFT performed today. °

## 2022-09-12 ENCOUNTER — Encounter: Payer: Self-pay | Admitting: Pulmonary Disease

## 2022-09-12 ENCOUNTER — Ambulatory Visit (INDEPENDENT_AMBULATORY_CARE_PROVIDER_SITE_OTHER): Payer: Medicare Other | Admitting: Pulmonary Disease

## 2022-09-12 VITALS — BP 122/68 | HR 65 | Wt 156.2 lb

## 2022-09-12 DIAGNOSIS — J4540 Moderate persistent asthma, uncomplicated: Secondary | ICD-10-CM | POA: Diagnosis not present

## 2022-09-12 NOTE — Patient Instructions (Addendum)
Nice to see you again  Continue Breo  If the cough worsens or you have stomach upset like lactose intolerance please let me know and we can look to change this  If everything stays good it is okay to continue Breo indefinitely  Return to clinic in 6 months or sooner as needed with Dr. Judeth Horn

## 2022-09-12 NOTE — Progress Notes (Signed)
@Patient  ID: [name], female    DOB: 1966-02-17, 57 y.o.   MRN: [id number]  Chief Complaint  Patient presents with   Follow-up    Pt is here for follow up for DOE> pt had PFT on Friday and is wanting to go over results. Pt states that she was on Symbicort in the past but due to insurance she is now on Breo. Pt states that she believes that Virgel Bouquet is working fine given not enough duration on medication less than 1 week.     Referring provider: Marinda Elk, MD  HPI:   57 y.o. woman whom are seeing in f/u for evaluation of chronic dry cough and dyspnea exertion felt to be related to asthma.  Overall doing okay.  Placed on Symbicort last visit.  Help with cough.  Intra-abdominal covering.  Now on Breo.  No real issues.  Only been unit for a week or so.  Things seem stable.  Discussed expectant management.  If things worsen with Virgel Bouquet can always additionally go back to a HFA as opposed to a DPI.  Reassured her about the small amount of lactose in the ingredients, she is lactose intolerant.  Reviewed PFTs in detail as below.  Moderate COPD but otherwise normal, likely related to chronic asthma.  Never smoker.  HPI initial visit: Patient notes shortness of breath for about 2 years.  Worse on inclines or stairs.  Occasionally can occur on flat surfaces as well.  Not really an issue at rest.  Unclear what interventions have been tried.  No seasonal or environmental factors that she can identify that make things better or worse.  No position that makes things better or worse.  No time of day when things are better or worse.  No other alleviating or exacerbating factors.  She also reports chronic cough.  Around the same timeline if not longer of the shortness of breath.  Dry.  She has GERD but denies breakthrough GERD symptoms or need for as needed antiacids.  No times a day with things better or worse.  No position make things better or worse.  No seasonal environmental factors she can identify  that make things better or worse.  No other alleviating or exacerbating factors.  Most recent chest x-ray, chest imaging remote, 07/23/2008, personally reviewed and interpreted as clear lungs bilaterally.  Chest x-ray 07/22/2008 personally reviewed with similar findings but with right lower lobe nodular opacity that is no longer present on the 07/23/2008 chest x-ray.  PMH: Hypertension, hyperlipidemia, GERD Surgical history: Tonsillectomy, adenoidectomy, breast biopsy, remote history G-tube now removed Family history: Denies significant respiratory illness in first-degree relatives Social history: Never smoker, lives in [place] / Pulmonary Flowsheets:   ACT:      No data to display           MMRC:     No data to display           Epworth:      No data to display           Tests:   FENO:  No results found for: "NITRICOXIDE"  PFT:    Latest Ref Rng & Units 09/09/2022    3:14 PM  PFT Results  FVC-Pre L 2.82  P  FVC-Predicted Pre % 95  P  FVC-Post L 2.59  P  FVC-Predicted Post % 87  P  Pre FEV1/FVC % % 53  P  Post FEV1/FCV % % 42  P  FEV1-Pre  L 1.48  P  FEV1-Predicted Pre % 64  P  FEV1-Post L 1.09  P  DLCO uncorrected ml/min/mmHg 20.74  P  DLCO UNC% % 115  P  DLCO corrected ml/min/mmHg 20.74  P  DLCO COR %Predicted % 115  P  DLVA Predicted % 117  P  TLC L 4.35  P  TLC % Predicted % 97  P  RV % Predicted % 86  P    P Preliminary result   09/2022-personally reviewed and interpreted as spirometry consistent with moderate fixed obstruction, no bronchodilator response, lung volumes within normal notes, DLCO within normal limits to elevated.  Fixed obstruction with normal/elevated DLCO most likely related to chronic asthmatic changes.  WALK:      No data to display           Imaging: Personally reviewed and as per EMR discussion in this note  Lab Results: Personally reviewed CBC    Component Value Date/Time   WBC 6.8  10/15/2021 1555   RBC 4.75 10/15/2021 1555   HGB 14.2 10/15/2021 1555   HCT 40.8 10/15/2021 1555   PLT 213 10/15/2021 1555   MCV 85.9 10/15/2021 1555   MCH 29.9 10/15/2021 1555   MCHC 34.8 10/15/2021 1555   RDW 12.1 10/15/2021 1555   LYMPHSABS 1.6 10/15/2021 1555   MONOABS 0.4 10/15/2021 1555   EOSABS 0.2 10/15/2021 1555   BASOSABS 0.0 10/15/2021 1555    BMET    Component Value Date/Time   NA 140 10/15/2021 1555   K 3.5 10/17/2021 1520   CL 104 10/15/2021 1555   CO2 28 10/15/2021 1555   GLUCOSE 87 10/15/2021 1555   BUN 15 10/15/2021 1555   CREATININE 1.17 (H) 10/15/2021 1555   CALCIUM 9.6 10/15/2021 1555   CALCIUM 7.1 (L) 08/05/2008 0345   GFRNONAA 55 (L) 10/15/2021 1555   GFRAA >60 04/18/2019 0339    BNP No results found for: "BNP"  ProBNP No results found for: "PROBNP"  Specialty Problems   None   Allergies  Allergen Reactions   Ascorbic Acid & Derivatives Other (See Comments)    Caused blisters in the mouth and inside other places of the body   Citrus Other (See Comments)    Citrus/orange juice: Caused blisters in the mouth and inside other places of the body   Luvox [Fluvoxamine] Other (See Comments)    Agitation, irritability, and anger   Other Other (See Comments)    (Citrus, ascorbic acid, and orange juice) Blisters in mouth and inside other places of the body.    Sulfa Antibiotics Other (See Comments)    Not sure of allergy but happened when she used a topical sulfa cream.   Sulfate     Any medication containing sulfa    Immunization History  Administered Date(s) Administered   Influenza Split 05/06/2012    Past Medical History:  Diagnosis Date   Back pain    Depression    GERD (gastroesophageal reflux disease)    High cholesterol    Schizoaffective disorder     Tobacco History: Social History   Tobacco Use  Smoking Status Never   Passive exposure: Never  Smokeless Tobacco Never   Counseling given: Not Answered   Continue to  not smoke  Outpatient Encounter Medications as of 09/12/2022  Medication Sig   amLODipine (NORVASC) 2.5 MG tablet Take 2.5 mg by mouth daily.   atorvastatin (LIPITOR) 20 MG tablet Take 20 mg by mouth daily.   BREO ELLIPTA 100-25 MCG/ACT  AEPB Inhale 1 puff into the lungs daily.   fluPHENAZine (PROLIXIN) 1 MG tablet Take 1 tablet (1 mg total) by mouth 3 (three) times daily.   hydrochlorothiazide (HYDRODIURIL) 12.5 MG tablet Take 12.5 mg by mouth daily.   hydrOXYzine (ATARAX) 25 MG tablet Take 1 tablet (25 mg total) by mouth 3 (three) times daily as needed for anxiety.   lamoTRIgine (LAMICTAL) 200 MG tablet Take 200 mg by mouth daily.   pantoprazole (PROTONIX) 40 MG tablet Take 1 tablet (40 mg total) by mouth 2 (two) times daily. (Patient taking differently: Take 40 mg by mouth daily.)   traZODone (DESYREL) 100 MG tablet Take 200 mg by mouth at bedtime as needed for sleep.   [DISCONTINUED] budesonide-formoterol (SYMBICORT) 160-4.5 MCG/ACT inhaler Inhale 2 puffs into the lungs 2 (two) times daily.   No facility-administered encounter medications on file as of 09/12/2022.     Review of Systems  Review of Systems  N/a Physical Exam  BP 122/68 (BP Location: Left Arm, Patient Position: Sitting, Cuff Size: Normal)   Pulse 65   Wt 156 lb 3.2 oz (70.9 kg)   LMP 04/23/2012   SpO2 99%   BMI 30.51 kg/m   Wt Readings from Last 5 Encounters:  09/12/22 156 lb 3.2 oz (70.9 kg)  04/21/22 161 lb 3.2 oz (73.1 kg)  04/14/22 158 lb 3.2 oz (71.8 kg)  11/24/21 174 lb (78.9 kg)  10/11/21 173 lb (78.5 kg)    BMI Readings from Last 5 Encounters:  09/12/22 30.51 kg/m  04/21/22 31.48 kg/m  04/14/22 30.90 kg/m  11/24/21 33.98 kg/m  10/11/21 33.79 kg/m     Physical Exam General: Sitting in chair, in no acute distress Eyes: EOMI, no icterus Neck: Supple, no JVP Pulmonary: Clear, normal work of breathing Cardiovascular: Warm, no edema Abdomen: Nondistended, bowel sounds present MSK: No  synovitis, no joint effusion Neuro: Normal gait, no weakness Psych: Normal mood, flat affect   Assessment & Plan:   Dyspnea on exertion: With associated cough.  High suspicion for asthma.  PFT 4/24 for extraction otherwise normal, consistent with chronic asthma changes.  Chronic cough: Dry, present for 2 years, associated with dyspnea on exertion.  Possible GERD related, silent reflux at night given dry cough.  Chest x-ray clear 04/2022.  High suspicion for asthma in addition to GERD.  Improved with asthma therapy.  Asthma: Clinical diagnosis based on dyspnea exertion and cough.  Cough improved with Symbicort.  Now on Breo due to insurance changes.  Continue Breo 1 puff daily.   Return in about 6 months (around 03/14/2023) for f/u Dr. Judeth HornHunsucker.   Karren BurlyMatthew R Eisa Conaway, MD 04/21/2022

## 2023-05-24 ENCOUNTER — Ambulatory Visit: Payer: Medicare Other | Admitting: Pulmonary Disease

## 2023-05-24 ENCOUNTER — Encounter: Payer: Self-pay | Admitting: Pulmonary Disease

## 2023-05-24 VITALS — BP 108/70 | HR 76 | Temp 98.2°F | Ht 60.0 in | Wt 155.6 lb

## 2023-05-24 DIAGNOSIS — J4540 Moderate persistent asthma, uncomplicated: Secondary | ICD-10-CM

## 2023-05-24 DIAGNOSIS — J45909 Unspecified asthma, uncomplicated: Secondary | ICD-10-CM | POA: Diagnosis not present

## 2023-05-24 MED ORDER — BUDESONIDE-FORMOTEROL FUMARATE 160-4.5 MCG/ACT IN AERO: 2.0000 | INHALATION_SPRAY | Freq: Two times a day (BID) | RESPIRATORY_TRACT | 12 refills | Status: DC

## 2023-05-24 NOTE — Progress Notes (Signed)
@Patient  ID: [name], female    DOB: 12/31/1965, 57 y.o.   MRN: [id number]  Chief Complaint  Patient presents with   Follow-up    Difficult to take a deep breath comes and goes, does not matter whether she is sitting or active.  She cannot use the the inhalers because they have lactose in them.    Referring provider: Merrilyn Puma, DO  HPI:   57 y.o. woman whom are seeing in f/u for evaluation of chronic dry cough and dyspnea exertion felt to be related to asthma.  At last visit was doing fine.  Switch to [facility].  Unfortunately developed GI issues.  A lot of abdominal discomfort bloating and stool incontinence. Same with Advair. 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  Breathing worse. Off maintenance inhaler for months.  HPI initial visit: Patient notes shortness of breath for about 2 years.  Worse on inclines or stairs.  Occasionally can occur on flat surfaces as well.  Not really an issue at rest.  Unclear what interventions have been tried.  No seasonal or environmental factors that she can identify that make things better or worse.  No position that makes things better or worse.  No time of day when things are better or worse.  No other alleviating or exacerbating factors.  She also reports chronic cough.  Around the same timeline if not longer of the shortness of breath.  Dry.  She has GERD but denies breakthrough GERD symptoms or need for as needed antiacids.  No times a day with things better or worse.  No position make things better or worse.  No seasonal environmental factors she can identify that make things better or worse.  No other alleviating or exacerbating factors.  Most recent chest x-ray, chest imaging remote, 07/23/2008, personally reviewed and interpreted as clear lungs bilaterally.  Chest x-ray 07/22/2008 personally reviewed with similar findings but with right lower lobe nodular opacity that is no longer present on the 07/23/2008 chest x-ray.  PMH:  Hypertension, hyperlipidemia, GERD Surgical history: Tonsillectomy, adenoidectomy, breast biopsy, remote history G-tube now removed Family history: Denies significant respiratory illness in first-degree relatives Social history: Never smoker, lives in [place] / Pulmonary Flowsheets:   ACT:  Asthma Control Test ACT Total Score  05/24/2023  1:04 PM 14    MMRC:     No data to display          Epworth:      No data to display          Tests:   FENO:  No results found for: "NITRICOXIDE"  PFT:    Latest Ref Rng & Units 09/09/2022    3:14 PM  PFT Results  FVC-Pre L 2.82   FVC-Predicted Pre % 95   FVC-Post L 2.59   FVC-Predicted Post % 87   Pre FEV1/FVC % % 53   Post FEV1/FCV % % 42   FEV1-Pre L 1.48   FEV1-Predicted Pre % 64   FEV1-Post L 1.09   DLCO uncorrected ml/min/mmHg 20.74   DLCO UNC% % 115   DLCO corrected ml/min/mmHg 20.74   DLCO COR %Predicted % 115   DLVA Predicted % 117   TLC L 4.35   TLC % Predicted % 97   RV % Predicted % 86   09/2022-personally reviewed and interpreted as spirometry consistent with moderate fixed obstruction, no bronchodilator response, lung volumes within normal notes, DLCO within normal limits to elevated.  Fixed obstruction with normal/elevated DLCO most  likely related to chronic asthmatic changes.  WALK:      No data to display          Imaging: Personally reviewed and as per EMR discussion in this note  Lab Results: Personally reviewed CBC    Component Value Date/Time   WBC 6.8 10/15/2021 1555   RBC 4.75 10/15/2021 1555   HGB 14.2 10/15/2021 1555   HCT 40.8 10/15/2021 1555   PLT 213 10/15/2021 1555   MCV 85.9 10/15/2021 1555   MCH 29.9 10/15/2021 1555   MCHC 34.8 10/15/2021 1555   RDW 12.1 10/15/2021 1555   LYMPHSABS 1.6 10/15/2021 1555   MONOABS 0.4 10/15/2021 1555   EOSABS 0.2 10/15/2021 1555   BASOSABS 0.0 10/15/2021 1555    BMET    Component Value Date/Time   NA 140  10/15/2021 1555   K 3.5 10/17/2021 1520   CL 104 10/15/2021 1555   CO2 28 10/15/2021 1555   GLUCOSE 87 10/15/2021 1555   BUN 15 10/15/2021 1555   CREATININE 1.17 (H) 10/15/2021 1555   CALCIUM 9.6 10/15/2021 1555   CALCIUM 7.1 (L) 08/05/2008 0345   GFRNONAA 55 (L) 10/15/2021 1555   GFRAA >60 04/18/2019 0339    BNP No results found for: "BNP"  ProBNP No results found for: "PROBNP"  Specialty Problems   None   Allergies  Allergen Reactions   Ascorbic Acid & Derivatives Other (See Comments)    Caused blisters in the mouth and inside other places of the body   Citrus Other (See Comments)    Citrus/orange juice: Caused blisters in the mouth and inside other places of the body   Luvox [Fluvoxamine] Other (See Comments)    Agitation, irritability, and anger   Other Other (See Comments)    (Citrus, ascorbic acid, and orange juice) Blisters in mouth and inside other places of the body.    Sulfa Antibiotics Other (See Comments)    Not sure of allergy but happened when she used a topical sulfa cream.   Sulfate     Any medication containing sulfa   Lactose Intolerance (Gi)     Immunization History  Administered Date(s) Administered   Influenza Split 05/06/2012   Influenza-Unspecified 03/21/2023    Past Medical History:  Diagnosis Date   Back pain    Depression    GERD (gastroesophageal reflux disease)    High cholesterol    Schizoaffective disorder (HCC)     Tobacco History: Social History   Tobacco Use  Smoking Status Never   Passive exposure: Never  Smokeless Tobacco Never   Counseling given: Not Answered   Continue to not smoke  Outpatient Encounter Medications as of 05/24/2023  Medication Sig   amLODipine (NORVASC) 2.5 MG tablet Take 2.5 mg by mouth daily.   atorvastatin (LIPITOR) 20 MG tablet Take 20 mg by mouth daily.   budesonide-formoterol (SYMBICORT) 160-4.5 MCG/ACT inhaler Inhale 2 puffs into the lungs 2 (two) times daily.   fluPHENAZine  (PROLIXIN) 1 MG tablet Take 1 tablet (1 mg total) by mouth 3 (three) times daily.   hydrochlorothiazide (HYDRODIURIL) 12.5 MG tablet Take 12.5 mg by mouth daily.   hydrOXYzine (ATARAX) 25 MG tablet Take 1 tablet (25 mg total) by mouth 3 (three) times daily as needed for anxiety.   lamoTRIgine (LAMICTAL) 200 MG tablet Take 200 mg by mouth daily.   pantoprazole (PROTONIX) 40 MG tablet Take 1 tablet (40 mg total) by mouth 2 (two) times daily. (Patient taking differently: Take 40 mg by  mouth daily.)   traZODone (DESYREL) 100 MG tablet Take 200 mg by mouth at bedtime as needed for sleep.   [DISCONTINUED] BREO ELLIPTA 100-25 MCG/ACT AEPB Inhale 1 puff into the lungs daily. (Patient not taking: Reported on 05/24/2023)   No facility-administered encounter medications on file as of 05/24/2023.     Review of Systems  Review of Systems  N/a Physical Exam  BP 108/70 (BP Location: Left Arm, Patient Position: Sitting, Cuff Size: Normal)   Pulse 76   Temp 98.2 F (36.8 C) (Oral)   Ht 5' (1.524 m)   Wt 155 lb 9.6 oz (70.6 kg)   LMP 04/23/2012   SpO2 99%   BMI 30.39 kg/m   Wt Readings from Last 5 Encounters:  05/24/23 155 lb 9.6 oz (70.6 kg)  09/12/22 156 lb 3.2 oz (70.9 kg)  04/21/22 161 lb 3.2 oz (73.1 kg)  04/14/22 158 lb 3.2 oz (71.8 kg)  11/24/21 174 lb (78.9 kg)    BMI Readings from Last 5 Encounters:  05/24/23 30.39 kg/m  09/12/22 30.51 kg/m  04/21/22 31.48 kg/m  04/14/22 30.90 kg/m  11/24/21 33.98 kg/m     Physical Exam General: Sitting in chair, in no acute distress Eyes: EOMI, no icterus Neck: Supple, no JVP Pulmonary: Clear, normal work of breathing Cardiovascular: Warm, no edema Abdomen: Nondistended, bowel sounds present MSK: No synovitis, no joint effusion Neuro: Normal gait, no weakness Psych: Normal mood, flat affect   Assessment & Plan:   Dyspnea on exertion: With associated cough.  High suspicion for asthma.  PFT 4/24 for extraction otherwise normal,  consistent with chronic asthma changes.  Chronic cough: Dry, present for 2 years, associated with dyspnea on exertion.  Possible GERD related, silent reflux at night given dry cough.  Chest x-ray clear 04/2022.  High suspicion for asthma in addition to GERD.  Improved with asthma therapy.  Asthma: Clinical diagnosis based on dyspnea exertion and cough.  Cough improved with Symbicort.  Switch to Breo, Advair.  Intolerant of lactose containing stuff and GI upset.  Discussed at length with pharmacy via phone today.  New prescription for Symbicort sent to pharmacy.   Return in about 3 months (around 08/22/2023) for f/u Dr. Judeth Horn.   Karren Burly, MD 04/21/2022

## 2023-05-24 NOTE — Patient Instructions (Signed)
Nice to see you again  Avoid inhalers with dry powder, these include Breo, Advair discus, Wixela, Trelegy.  I provided an inhaler called Breztri.  Use 2 puffs twice a day every day.  Rinse your mouth out with water after every use.  Each sample last 1 week  The samples are reviewed to use until we can get Symbicort or something similar again  Return to clinic in 3 months or sooner as needed with Dr. Judeth Horn

## 2023-06-05 ENCOUNTER — Other Ambulatory Visit (HOSPITAL_COMMUNITY): Payer: Self-pay

## 2023-06-05 ENCOUNTER — Telehealth: Payer: Self-pay

## 2023-06-05 NOTE — Telephone Encounter (Signed)
Pharmacy Patient Advocate Encounter   Received notification from CoverMyMeds that prior authorization for Budesonide-Formoterol Fumarate 160-4.5MCG/ACT aerosol  is required/requested.   Insurance verification completed.   The patient is insured through [facility] .   Per test claim: Refill too soon. PA is not needed at this time. Medication was filled 05/24/2023. Next eligible fill date is 06/15/2023.

## 2023-09-04 ENCOUNTER — Encounter: Payer: Self-pay | Admitting: Pulmonary Disease

## 2023-09-04 ENCOUNTER — Ambulatory Visit (INDEPENDENT_AMBULATORY_CARE_PROVIDER_SITE_OTHER): Payer: Medicare Other | Admitting: Pulmonary Disease

## 2023-09-04 VITALS — BP 122/64 | HR 60 | Ht 60.0 in | Wt 149.0 lb

## 2023-09-04 DIAGNOSIS — J4540 Moderate persistent asthma, uncomplicated: Secondary | ICD-10-CM | POA: Diagnosis not present

## 2023-09-04 MED ORDER — BECLOMETHASONE DIPROP HFA 80 MCG/ACT IN AERB: 2.0000 | INHALATION_SPRAY | Freq: Two times a day (BID) | RESPIRATORY_TRACT | 6 refills | Status: AC

## 2023-09-04 NOTE — Progress Notes (Signed)
 @Patient  ID: [name], female    DOB: [birth year], 58 y.o.   MRN: [id number]  Chief Complaint  Patient presents with   Follow-up    Pt states can't use symbicort     Referring provider: Merrilyn Puma, DO  HPI:   58 y.o. woman whom are seeing in f/u for evaluation of chronic dry cough and dyspnea exertion felt to be related to asthma.  At last visit her facility is giving her a hard time with obtaining the Symbicort.  According high numbers, high co-pay.  We discussed is on the Medicaid formulary and should be $4 co-pay.  Not sure really what the issue is other than just a mistake.  I called and clarified with pharmacist and was assured Symbicort would be provided at the [facility] co-pay.  She returns today for routine follow-up presumably now on Symbicort, she had been off maintenance inhaler for some time due to lack of access to Symbicort and intolerance of Breo due to lactose intolerance with worsening symptoms at last visit.  Despite using Symbicort in the past she now tells me she is intolerant cannot use it due to presence of lactose and Symbicort.  This in the setting of decreasing PPI dose in half, on higher doses of PPI she had no GI symptoms with the Symbicort.  We discussed at length using a nonlactose containing inhaler she provided a list of medications that she thought she could tolerate without lactose.  All are on Medicaid formulary, Qvar prescribed today.  Dyspnea is worse since being off maintenance inhaler.  Not much cough.  Pollen has not been bothersome too bad to date.  HPI initial visit: Patient notes shortness of breath for about 2 years.  Worse on inclines or stairs.  Occasionally can occur on flat surfaces as well.  Not really an issue at rest.  Unclear what interventions have been tried.  No seasonal or environmental factors that she can identify that make things better or worse.  No position that makes things better or worse.  No time of day when things are  better or worse.  No other alleviating or exacerbating factors.  She also reports chronic cough.  Around the same timeline if not longer of the shortness of breath.  Dry.  She has GERD but denies breakthrough GERD symptoms or need for as needed antiacids.  No times a day with things better or worse.  No position make things better or worse.  No seasonal environmental factors she can identify that make things better or worse.  No other alleviating or exacerbating factors.  Most recent chest x-ray, chest imaging remote, 07/23/2008, personally reviewed and interpreted as clear lungs bilaterally.  Chest x-ray 07/22/2008 personally reviewed with similar findings but with right lower lobe nodular opacity that is no longer present on the 07/23/2008 chest x-ray.  PMH: Hypertension, hyperlipidemia, GERD Surgical history: Tonsillectomy, adenoidectomy, breast biopsy, remote history G-tube now removed Family history: Denies significant respiratory illness in first-degree relatives Social history: Never smoker, lives in [place] / Pulmonary Flowsheets:   ACT:  Asthma Control Test ACT Total Score  05/24/2023  1:04 PM 14    MMRC:     No data to display          Epworth:      No data to display          Tests:   FENO:  No results found for: "NITRICOXIDE"  PFT:    Latest Ref Rng & Units  09/09/2022    3:14 PM  PFT Results  FVC-Pre L 2.82   FVC-Predicted Pre % 95   FVC-Post L 2.59   FVC-Predicted Post % 87   Pre FEV1/FVC % % 53   Post FEV1/FCV % % 42   FEV1-Pre L 1.48   FEV1-Predicted Pre % 64   FEV1-Post L 1.09   DLCO uncorrected ml/min/mmHg 20.74   DLCO UNC% % 115   DLCO corrected ml/min/mmHg 20.74   DLCO COR %Predicted % 115   DLVA Predicted % 117   TLC L 4.35   TLC % Predicted % 97   RV % Predicted % 86   09/2022-personally reviewed and interpreted as spirometry consistent with moderate fixed obstruction, no bronchodilator response, lung volumes within  normal notes, DLCO within normal limits to elevated.  Fixed obstruction with normal/elevated DLCO most likely related to chronic asthmatic changes.  WALK:      No data to display          Imaging: Personally reviewed and as per EMR discussion in this note  Lab Results: Personally reviewed CBC    Component Value Date/Time   WBC 6.8 10/15/2021 1555   RBC 4.75 10/15/2021 1555   HGB 14.2 10/15/2021 1555   HCT 40.8 10/15/2021 1555   PLT 213 10/15/2021 1555   MCV 85.9 10/15/2021 1555   MCH 29.9 10/15/2021 1555   MCHC 34.8 10/15/2021 1555   RDW 12.1 10/15/2021 1555   LYMPHSABS 1.6 10/15/2021 1555   MONOABS 0.4 10/15/2021 1555   EOSABS 0.2 10/15/2021 1555   BASOSABS 0.0 10/15/2021 1555    BMET    Component Value Date/Time   NA 140 10/15/2021 1555   K 3.5 10/17/2021 1520   CL 104 10/15/2021 1555   CO2 28 10/15/2021 1555   GLUCOSE 87 10/15/2021 1555   BUN 15 10/15/2021 1555   CREATININE 1.17 (H) 10/15/2021 1555   CALCIUM 9.6 10/15/2021 1555   CALCIUM 7.1 (L) 08/05/2008 0345   GFRNONAA 55 (L) 10/15/2021 1555   GFRAA >60 04/18/2019 0339    BNP No results found for: "BNP"  ProBNP No results found for: "PROBNP"  Specialty Problems   None   Allergies  Allergen Reactions   Ascorbic Acid & Derivatives Other (See Comments)    Caused blisters in the mouth and inside other places of the body   Citrus Other (See Comments)    Citrus/orange juice: Caused blisters in the mouth and inside other places of the body   Luvox [Fluvoxamine] Other (See Comments)    Agitation, irritability, and anger   Other Other (See Comments)    (Citrus, ascorbic acid, and orange juice) Blisters in mouth and inside other places of the body.    Sulfa Antibiotics Other (See Comments)    Not sure of allergy but happened when she used a topical sulfa cream.   Sulfate     Any medication containing sulfa   Lactose Intolerance (Gi)     Immunization History  Administered Date(s) Administered    Influenza Split 05/06/2012   Influenza-Unspecified 03/21/2023    Past Medical History:  Diagnosis Date   Back pain    Depression    GERD (gastroesophageal reflux disease)    High cholesterol    Schizoaffective disorder (HCC)     Tobacco History: Social History   Tobacco Use  Smoking Status Never   Passive exposure: Never  Smokeless Tobacco Never   Counseling given: Not Answered   Continue to not smoke  Outpatient Encounter  Medications as of 09/04/2023  Medication Sig   amLODipine (NORVASC) 2.5 MG tablet Take 2.5 mg by mouth daily.   atorvastatin (LIPITOR) 20 MG tablet Take 20 mg by mouth daily.   beclomethasone (QVAR) 80 MCG/ACT inhaler Inhale 2 puffs into the lungs 2 (two) times daily.   fluPHENAZine (PROLIXIN) 1 MG tablet Take 1 tablet (1 mg total) by mouth 3 (three) times daily.   hydrochlorothiazide (HYDRODIURIL) 12.5 MG tablet Take 12.5 mg by mouth daily.   hydrOXYzine (ATARAX) 25 MG tablet Take 1 tablet (25 mg total) by mouth 3 (three) times daily as needed for anxiety.   lamoTRIgine (LAMICTAL) 200 MG tablet Take 200 mg by mouth daily.   pantoprazole (PROTONIX) 40 MG tablet Take 1 tablet (40 mg total) by mouth 2 (two) times daily. (Patient taking differently: Take 40 mg by mouth daily.)   traZODone (DESYREL) 100 MG tablet Take 200 mg by mouth at bedtime as needed for sleep.   [DISCONTINUED] budesonide-formoterol (SYMBICORT) 160-4.5 MCG/ACT inhaler Inhale 2 puffs into the lungs 2 (two) times daily.   No facility-administered encounter medications on file as of 09/04/2023.     Review of Systems  Review of Systems  N/a Physical Exam  BP 122/64 (BP Location: Left Arm, Patient Position: Sitting, Cuff Size: Normal)   Pulse 60   Ht 5' (1.524 m)   Wt 149 lb (67.6 kg)   LMP 04/23/2012   SpO2 96%   BMI 29.10 kg/m   Wt Readings from Last 5 Encounters:  09/04/23 149 lb (67.6 kg)  05/24/23 155 lb 9.6 oz (70.6 kg)  09/12/22 156 lb 3.2 oz (70.9 kg)  04/21/22 161  lb 3.2 oz (73.1 kg)  04/14/22 158 lb 3.2 oz (71.8 kg)    BMI Readings from Last 5 Encounters:  09/04/23 29.10 kg/m  05/24/23 30.39 kg/m  09/12/22 30.51 kg/m  04/21/22 31.48 kg/m  04/14/22 30.90 kg/m     Physical Exam General: Sitting in chair, in no acute distress Eyes: EOMI, no icterus Neck: Supple, no JVP Pulmonary: Clear, normal work of breathing Cardiovascular: Warm, no edema Abdomen: Nondistended, bowel sounds present MSK: No synovitis, no joint effusion Neuro: Normal gait, no weakness Psych: Normal mood, flat affect   Assessment & Plan:   Dyspnea on exertion: With associated cough.  High suspicion for asthma.  PFT 4/24 moderate obstruction otherwise normal, consistent with chronic asthma changes.  Chronic cough: Dry, present for 2 years, associated with dyspnea on exertion.  Possible GERD related, silent reflux at night given dry cough.  Chest x-ray clear 04/2022.  High suspicion for asthma in addition to GERD.  Improved with asthma therapy.  Asthma: Clinical diagnosis based on dyspnea exertion and cough.  Cough improved with Symbicort.  Switch to Breo, Advair.  Intolerant of lactose containing products and GI upset.  Now with symptoms on Symbicort with decreasing PPI, low amount of lactose present in this product.  New prescription for Qvar, this is on Medicaid formulary and I discussed at length with the pharmacy of choice that they should run this on her Medicaid benefits but despite that it seems like they continued to deny medications under Medicare.  Expressed instructions to run under Medicaid benefits provided with prescription today for Qvar.  High-dose Qvar.  Assess response, we will see if there are LABA therapies without lactose.  Return in about 3 months (around 12/04/2023) for f/u Dr. Judeth Horn.   Karren Burly, MD 04/21/2022

## 2023-09-04 NOTE — Patient Instructions (Signed)
 Nice to see you again  Am sorry to Symbicort caused GI symptoms  Use Qvar 2 puffs twice a day, this should have no lactose in it  I sent a message to the pharmacy requesting they run under Medicaid benefits as it is listed on the Medicaid formulary and should be no more than $4 a month  Return to clinic in 3 months or sooner as needed with Dr. Judeth Horn

## 2023-10-02 ENCOUNTER — Telehealth: Payer: Self-pay

## 2023-10-02 ENCOUNTER — Telehealth: Payer: Self-pay | Admitting: Pharmacist

## 2023-10-02 ENCOUNTER — Other Ambulatory Visit (HOSPITAL_COMMUNITY): Payer: Self-pay

## 2023-10-02 NOTE — Telephone Encounter (Signed)
 Went ahead and submitted a PA- its asked about intolerance to Arnuity which I answered "yes" to due to the lactose ingredients.

## 2023-10-02 NOTE — Telephone Encounter (Signed)
 Could you please run PA for Qvar? Patient has lactose allergy and unable to take Symbicort  and other ICS/LABA. Is very sensitive to lactose excipient in inhalers  Caycee Wanat, PharmD, MPH, BCPS, CPP Clinical Pharmacist (Rheumatology and Pulmonology)

## 2023-10-02 NOTE — Telephone Encounter (Signed)
 Approved. This drug has been approved under the Member's Medicare Part D benefit. Approved quantity: 10.6 units per 30 day(s). You may fill up to a 90 day supply except for those on Specialty Tier 5, which can be filled up to a 30 day supply. Please call the pharmacy to process the prescription claim. Effective Date: 09/18/2023 Authorization Expiration Date: 06/05/2098

## 2023-10-02 NOTE — Telephone Encounter (Signed)
*  Pulm  Pharmacy Patient Advocate Encounter   Received notification from RX Request Messages that prior authorization for Qvar RediHaler 80MCG/ACT aerosol  is required/requested.   Insurance verification completed.   The patient is insured through [facility] .   Per test claim: PA required; PA submitted to above mentioned insurance via CoverMyMeds Key/confirmation #/EOC [id number] Status is pending   *patient allergic to lactose

## 2023-10-04 NOTE — Telephone Encounter (Signed)
 Spoke to [employer], Charity fundraiser with [facility] nursing home. She will make pt aware of approval. Nothing further needed.

## 2024-02-21 ENCOUNTER — Ambulatory Visit (INDEPENDENT_AMBULATORY_CARE_PROVIDER_SITE_OTHER): Admitting: Pulmonary Disease

## 2024-02-21 ENCOUNTER — Encounter: Payer: Self-pay | Admitting: Pulmonary Disease

## 2024-02-21 VITALS — BP 126/80 | HR 52 | Temp 98.2°F | Ht 60.0 in | Wt 146.6 lb

## 2024-02-21 DIAGNOSIS — R053 Chronic cough: Secondary | ICD-10-CM | POA: Diagnosis not present

## 2024-02-21 DIAGNOSIS — R0609 Other forms of dyspnea: Secondary | ICD-10-CM

## 2024-02-21 DIAGNOSIS — J45909 Unspecified asthma, uncomplicated: Secondary | ICD-10-CM | POA: Diagnosis not present

## 2024-02-21 DIAGNOSIS — J4540 Moderate persistent asthma, uncomplicated: Secondary | ICD-10-CM

## 2024-02-21 NOTE — Progress Notes (Signed)
 @Patient  ID: [name], female    DOB: [birth year], 58 y.o.   MRN: [id number]  Chief Complaint  Patient presents with   Medical Management of Chronic Issues    58 y.o.    Referring provider: Pennelope Castilla, DO  HPI:   58 y.o. woman whom are seeing in f/u for evaluation of chronic dry cough and dyspnea exertion felt to be related to asthma.  Multiple telephone encounter notes via our pharmacy colleagues in the interim reviewed.  Unfortunate, over the last several months difficulty obtaining inhalers.  She has lactose intolerance/allergy which makes most inhalers unable to use with GI upset.  We tried several months.  Her breathing and cough was a bit worse off the inhalers.  Eventually and finally was approved for Qvar.  She is taking this regular.  Has improved her cough and shortness of breath.  No upset stomach, no side effects.  She is doing well.  HPI initial visit: Patient notes shortness of breath for about 2 years.  Worse on inclines or stairs.  Occasionally can occur on flat surfaces as well.  Not really an issue at rest.  Unclear what interventions have been tried.  No seasonal or environmental factors that she can identify that make things better or worse.  No position that makes things better or worse.  No time of day when things are better or worse.  No other alleviating or exacerbating factors.  She also reports chronic cough.  Around the same timeline if not longer of the shortness of breath.  Dry.  She has GERD but denies breakthrough GERD symptoms or need for as needed antiacids.  No times a day with things better or worse.  No position make things better or worse.  No seasonal environmental factors she can identify that make things better or worse.  No other alleviating or exacerbating factors.  Most recent chest x-ray, chest imaging remote, 07/23/2008, personally reviewed and interpreted as clear lungs bilaterally.  Chest x-ray 07/22/2008 personally reviewed with similar  findings but with right lower lobe nodular opacity that is no longer present on the 07/23/2008 chest x-ray.  PMH: Hypertension, hyperlipidemia, GERD Surgical history: Tonsillectomy, adenoidectomy, breast biopsy, remote history G-tube now removed Family history: Denies significant respiratory illness in first-degree relatives Social history: Never smoker, lives in [place] / Pulmonary Flowsheets:   ACT:  Asthma Control Test ACT Total Score  02/21/2024  8:45 AM 22  05/24/2023  1:04 PM 14    MMRC:     No data to display          [facility]:      No data to display          Tests:   FENO:  No results found for: NITRICOXIDE  PFT:    Latest Ref Rng & Units 09/09/2022    3:14 PM  PFT Results  FVC-Pre L 2.82   FVC-Predicted Pre % 95   FVC-Post L 2.59   FVC-Predicted Post % 87   Pre FEV1/FVC % % 53   Post FEV1/FCV % % 42   FEV1-Pre L 1.48   FEV1-Predicted Pre % 64   FEV1-Post L 1.09   DLCO uncorrected ml/min/mmHg 20.74   DLCO UNC% % 115   DLCO corrected ml/min/mmHg 20.74   DLCO COR %Predicted % 115   DLVA Predicted % 117   TLC L 4.35   TLC % Predicted % 97   RV % Predicted % 86   09/2022-personally reviewed and  interpreted as spirometry consistent with moderate fixed obstruction, no bronchodilator response, lung volumes within normal notes, DLCO within normal limits to elevated.  Fixed obstruction with normal/elevated DLCO most likely related to chronic asthmatic changes.  WALK:      No data to display          Imaging: Personally reviewed and as per EMR discussion in this note  Lab Results: Personally reviewed CBC    Component Value Date/Time   WBC 6.8 10/15/2021 1555   RBC 4.75 10/15/2021 1555   HGB 14.2 10/15/2021 1555   HCT 40.8 10/15/2021 1555   PLT 213 10/15/2021 1555   MCV 85.9 10/15/2021 1555   MCH 29.9 10/15/2021 1555   MCHC 34.8 10/15/2021 1555   RDW 12.1 10/15/2021 1555   LYMPHSABS 1.6 10/15/2021 1555   MONOABS 0.4  10/15/2021 1555   EOSABS 0.2 10/15/2021 1555   BASOSABS 0.0 10/15/2021 1555    BMET    Component Value Date/Time   NA 140 10/15/2021 1555   K 3.5 10/17/2021 1520   CL 104 10/15/2021 1555   CO2 28 10/15/2021 1555   GLUCOSE 87 10/15/2021 1555   BUN 15 10/15/2021 1555   CREATININE 1.17 (H) 10/15/2021 1555   CALCIUM  9.6 10/15/2021 1555   CALCIUM  7.1 (L) 08/05/2008 0345   GFRNONAA 55 (L) 10/15/2021 1555   GFRAA >60 04/18/2019 0339    BNP No results found for: BNP  ProBNP No results found for: PROBNP  Specialty Problems   None   Allergies  Allergen Reactions   Ascorbic Acid & Derivatives Other (See Comments)    Caused blisters in the mouth and inside other places of the body   Citrus Other (See Comments)    Citrus/orange juice: Caused blisters in the mouth and inside other places of the body   Luvox [Fluvoxamine] Other (See Comments)    Agitation, irritability, and anger   Other Other (See Comments)    (Citrus, ascorbic acid, and orange juice) Blisters in mouth and inside other places of the body.    Sulfa Antibiotics Other (See Comments)    Not sure of allergy but happened when she used a topical sulfa cream.   Sulfate     Any medication containing sulfa   Lactose Intolerance (Gi)     Immunization History  Administered Date(s) Administered   Influenza Split 05/06/2012   Influenza-Unspecified 03/21/2023    Past Medical History:  Diagnosis Date   Back pain    Depression    GERD (gastroesophageal reflux disease)    High cholesterol    Schizoaffective disorder (HCC)     Tobacco History: Social History   Tobacco Use  Smoking Status Never   Passive exposure: Never  Smokeless Tobacco Never   Counseling given: Not Answered   Continue to not smoke  Outpatient Encounter Medications as of 02/21/2024  Medication Sig   amLODipine  (NORVASC ) 2.5 MG tablet Take 2.5 mg by mouth daily.   atorvastatin  (LIPITOR) 20 MG tablet Take 20 mg by mouth daily.    beclomethasone (QVAR) 80 MCG/ACT inhaler Inhale 2 puffs into the lungs 2 (two) times daily.   fluPHENAZine  (PROLIXIN ) 1 MG tablet Take 1 tablet (1 mg total) by mouth 3 (three) times daily.   hydrochlorothiazide  (HYDRODIURIL ) 12.5 MG tablet Take 12.5 mg by mouth daily.   hydrOXYzine  (ATARAX ) 25 MG tablet Take 1 tablet (25 mg total) by mouth 3 (three) times daily as needed for anxiety.   lamoTRIgine (LAMICTAL) 200 MG tablet Take 200 mg  by mouth daily.   pantoprazole  (PROTONIX ) 40 MG tablet Take 1 tablet (40 mg total) by mouth 2 (two) times daily. (Patient taking differently: Take 40 mg by mouth daily.)   traZODone  (DESYREL ) 100 MG tablet Take 200 mg by mouth at bedtime as needed for sleep.   No facility-administered encounter medications on file as of 02/21/2024.     Review of Systems  Review of Systems  N/a Physical Exam  BP 126/80   Pulse (!) 52   Temp 98.2 F (36.8 C) (Oral)   Ht 5' (1.524 m)   Wt 146 lb 9.6 oz (66.5 kg)   LMP 04/23/2012   SpO2 96%   BMI 28.63 kg/m   Wt Readings from Last 5 Encounters:  02/21/24 146 lb 9.6 oz (66.5 kg)  09/04/23 149 lb (67.6 kg)  05/24/23 155 lb 9.6 oz (70.6 kg)  09/12/22 156 lb 3.2 oz (70.9 kg)  04/21/22 161 lb 3.2 oz (73.1 kg)    BMI Readings from Last 5 Encounters:  02/21/24 28.63 kg/m  09/04/23 29.10 kg/m  05/24/23 30.39 kg/m  09/12/22 30.51 kg/m  04/21/22 31.48 kg/m     Physical Exam General: Sitting in chair, in no acute distress Eyes: EOMI, no icterus Neck: Supple, no JVP Pulmonary: Clear, normal work of breathing Cardiovascular: Warm, no edema Abdomen: Nondistended, bowel sounds present MSK: No synovitis, no joint effusion Neuro: Normal gait, no weakness Psych: Normal mood, flat affect   Assessment & Plan:   Dyspnea on exertion: With associated cough.  High suspicion for asthma.  PFT 4/24 moderate obstruction otherwise normal, consistent with chronic asthma changes.  Chronic cough: Dry, present for 2 years,  associated with dyspnea on exertion.  Possible GERD related, silent reflux at night given dry cough.  Chest x-ray clear 04/2022.  High suspicion for asthma in addition to GERD.  Improved with asthma therapy.  Asthma: Clinical diagnosis based on dyspnea exertion and cough.  Cough improved with Symbicort .  Switch to Breo, Advair.  Intolerant of lactose containing products and GI upset.  Now with symptoms on Symbicort  with decreasing PPI, low amount of lactose present in this product.  Now has been able to obtain Qvar.  Using regularly and improvement in symptoms.  To continue.  Return in about 6 months (around 08/20/2024) for f/u Dr. Annella.   Donnice JONELLE Annella, MD 04/21/2022

## 2024-02-21 NOTE — Patient Instructions (Signed)
 It is good to see you again  No changes in medication  Continue Qvar 2 puffs twice a day every day, rinse her mouth out thoroughly with water after every use.  I am glad this is helping and we are not having side effects  Return to clinic in 6 months or sooner as needed with Dr. Annella

## 2024-07-04 ENCOUNTER — Ambulatory Visit: Admitting: Pulmonary Disease

## 2024-07-05 ENCOUNTER — Ambulatory Visit: Admitting: Internal Medicine

## 2024-07-11 ENCOUNTER — Ambulatory Visit: Admitting: Allergy and Immunology

## 2024-08-07 ENCOUNTER — Ambulatory Visit: Admitting: Pulmonary Disease

## 2024-08-12 ENCOUNTER — Ambulatory Visit: Admitting: Pulmonary Disease
